# Patient Record
Sex: Male | Born: 1960 | Race: White | Hispanic: No | Smoking: Never smoker
Health system: Southern US, Community
[De-identification: ages and names within clinical notes are randomized; demographics above are authoritative.]

## PROBLEM LIST (undated history)

## (undated) DIAGNOSIS — G4730 Sleep apnea, unspecified: Secondary | ICD-10-CM

## (undated) DIAGNOSIS — K279 Peptic ulcer, site unspecified, unspecified as acute or chronic, without hemorrhage or perforation: Secondary | ICD-10-CM

## (undated) DIAGNOSIS — E119 Type 2 diabetes mellitus without complications: Secondary | ICD-10-CM

## (undated) DIAGNOSIS — I10 Essential (primary) hypertension: Secondary | ICD-10-CM

## (undated) DIAGNOSIS — E785 Hyperlipidemia, unspecified: Secondary | ICD-10-CM

## (undated) DIAGNOSIS — I700 Atherosclerosis of aorta: Secondary | ICD-10-CM

## (undated) DIAGNOSIS — I712 Thoracic aortic aneurysm, without rupture: Secondary | ICD-10-CM

## (undated) DIAGNOSIS — Q613 Polycystic kidney, unspecified: Secondary | ICD-10-CM

## (undated) DIAGNOSIS — K4021 Bilateral inguinal hernia, without obstruction or gangrene, recurrent: Secondary | ICD-10-CM

## (undated) DIAGNOSIS — I7121 Aneurysm of the ascending aorta, without rupture: Secondary | ICD-10-CM

## (undated) DIAGNOSIS — N183 Chronic kidney disease, stage 3 unspecified: Secondary | ICD-10-CM

## (undated) HISTORY — DX: Chronic kidney disease, stage 3 (moderate): N18.3

## (undated) HISTORY — DX: Type 2 diabetes mellitus without complications: E11.9

## (undated) HISTORY — DX: Aneurysm of the ascending aorta, without rupture: I71.21

## (undated) HISTORY — DX: Atherosclerosis of aorta: I70.0

## (undated) HISTORY — DX: Thoracic aortic aneurysm, without rupture: I71.2

## (undated) HISTORY — DX: Essential (primary) hypertension: I10

## (undated) HISTORY — DX: Polycystic kidney, unspecified: Q61.3

## (undated) HISTORY — DX: Hyperlipidemia, unspecified: E78.5

## (undated) HISTORY — DX: Chronic kidney disease, stage 3 unspecified: N18.30

## (undated) HISTORY — DX: Peptic ulcer, site unspecified, unspecified as acute or chronic, without hemorrhage or perforation: K27.9

## (undated) HISTORY — PX: HEMORRHOID SURGERY: SHX153

---

## 2000-10-03 ENCOUNTER — Encounter: Admission: RE | Admit: 2000-10-03 | Discharge: 2000-11-15 | Payer: Self-pay | Admitting: Nephrology

## 2001-04-06 ENCOUNTER — Encounter: Payer: Self-pay | Admitting: Nephrology

## 2001-04-06 ENCOUNTER — Encounter: Admission: RE | Admit: 2001-04-06 | Discharge: 2001-04-06 | Payer: Self-pay | Admitting: Nephrology

## 2002-03-11 ENCOUNTER — Encounter: Payer: Self-pay | Admitting: Cardiology

## 2002-03-11 ENCOUNTER — Encounter: Admission: RE | Admit: 2002-03-11 | Discharge: 2002-03-11 | Payer: Self-pay | Admitting: Cardiology

## 2002-03-12 ENCOUNTER — Ambulatory Visit (HOSPITAL_COMMUNITY): Admission: RE | Admit: 2002-03-12 | Discharge: 2002-03-12 | Payer: Self-pay | Admitting: Cardiology

## 2002-03-12 ENCOUNTER — Encounter: Payer: Self-pay | Admitting: Cardiology

## 2003-04-24 ENCOUNTER — Ambulatory Visit (HOSPITAL_COMMUNITY): Admission: RE | Admit: 2003-04-24 | Discharge: 2003-04-24 | Payer: Self-pay | Admitting: Cardiology

## 2004-11-03 ENCOUNTER — Ambulatory Visit (HOSPITAL_COMMUNITY): Admission: RE | Admit: 2004-11-03 | Discharge: 2004-11-03 | Payer: Self-pay | Admitting: Cardiology

## 2006-05-02 ENCOUNTER — Emergency Department (HOSPITAL_COMMUNITY): Admission: EM | Admit: 2006-05-02 | Discharge: 2006-05-02 | Payer: Self-pay | Admitting: Emergency Medicine

## 2013-02-08 ENCOUNTER — Other Ambulatory Visit (HOSPITAL_COMMUNITY): Payer: Self-pay | Admitting: Family Medicine

## 2013-02-08 DIAGNOSIS — R109 Unspecified abdominal pain: Secondary | ICD-10-CM

## 2013-02-08 DIAGNOSIS — I712 Thoracic aortic aneurysm, without rupture, unspecified: Secondary | ICD-10-CM

## 2013-02-22 ENCOUNTER — Ambulatory Visit (HOSPITAL_COMMUNITY)
Admission: RE | Admit: 2013-02-22 | Discharge: 2013-02-22 | Disposition: A | Discharge status: Home / Self Care | Payer: BC Managed Care – PPO | Source: Ambulatory Visit | Attending: Family Medicine | Admitting: Family Medicine

## 2013-02-22 DIAGNOSIS — I2510 Atherosclerotic heart disease of native coronary artery without angina pectoris: Secondary | ICD-10-CM | POA: Insufficient documentation

## 2013-02-22 DIAGNOSIS — R109 Unspecified abdominal pain: Secondary | ICD-10-CM

## 2013-02-22 DIAGNOSIS — I712 Thoracic aortic aneurysm, without rupture: Secondary | ICD-10-CM

## 2013-02-22 DIAGNOSIS — I700 Atherosclerosis of aorta: Secondary | ICD-10-CM | POA: Insufficient documentation

## 2013-02-22 DIAGNOSIS — I77819 Aortic ectasia, unspecified site: Secondary | ICD-10-CM | POA: Insufficient documentation

## 2013-02-22 DIAGNOSIS — Q613 Polycystic kidney, unspecified: Secondary | ICD-10-CM | POA: Insufficient documentation

## 2013-03-05 DIAGNOSIS — I712 Thoracic aortic aneurysm, without rupture: Secondary | ICD-10-CM | POA: Insufficient documentation

## 2013-03-06 ENCOUNTER — Encounter: Payer: Self-pay | Admitting: Surgery

## 2013-03-06 ENCOUNTER — Institutional Professional Consult (permissible substitution) (INDEPENDENT_AMBULATORY_CARE_PROVIDER_SITE_OTHER): Payer: BC Managed Care – PPO | Admitting: Surgery

## 2013-03-06 VITALS — BP 121/77 | HR 69 | Resp 20 | Ht 67.0 in | Wt 208.0 lb

## 2013-03-06 DIAGNOSIS — I712 Thoracic aortic aneurysm, without rupture: Secondary | ICD-10-CM

## 2013-03-07 ENCOUNTER — Encounter: Payer: Self-pay | Admitting: Surgery

## 2013-03-07 ENCOUNTER — Encounter: Payer: Self-pay | Admitting: Cardiology

## 2013-03-07 DIAGNOSIS — I10 Essential (primary) hypertension: Secondary | ICD-10-CM | POA: Insufficient documentation

## 2013-03-07 DIAGNOSIS — E785 Hyperlipidemia, unspecified: Secondary | ICD-10-CM

## 2013-03-07 DIAGNOSIS — I700 Atherosclerosis of aorta: Secondary | ICD-10-CM | POA: Insufficient documentation

## 2013-03-07 HISTORY — DX: Hyperlipidemia, unspecified: E78.5

## 2013-03-07 HISTORY — DX: Essential (primary) hypertension: I10

## 2013-03-07 HISTORY — DX: Atherosclerosis of aorta: I70.0

## 2013-03-07 NOTE — Progress Notes (Signed)
PCP is Lupita Raider, MD Referring Provider is Lupita Raider, MD  Chief Complaint  Patient presents with  . Thoracic Aortic Aneurysm    Eval ascending aortic aneursym 4.2, CT done 11/7    HPI:   The patient is a 52 year old gentleman with diabetes, HTN, and hyperlipidemia and a hx of ascending aortic aneurysm first diagnosed on CT scan in 2003. It was noted to be 4.2 cm at that time. He had been followed by Dr. Caprice Kluver in the past and apparently underwent cardiac cath in the past with no significant coronary stenoses found, although I can't find the report in EPIC. He had follow up Chest CT scan in 2005 and the aneurysm was 4.3 cm. A scan in 2006 gave a measurement of the aneurysm of 3.7 cm. A follow-up scan now shows the diameter to be 4.2 cm. He reports some left sided chest discomfort that is not always exertionally related. There is no associated dyspnea and no radiation of the chest discomfort. His exertional tolerance is unchanged.  Past Medical History  Diagnosis Date  . Ascending aortic aneurysm   . Diabetes mellitus   . HBP (high blood pressure)   . Hyperlipidemia   . CKD (chronic kidney disease), stage III   . Polycystic kidney disease   . PUD (peptic ulcer disease)     History reviewed. No pertinent past surgical history.  Family History  Problem Relation Age of Onset  . Polycystic kidney disease Mother   . Polycystic kidney disease Brother   . Diabetes Sister     Social History History  Substance Use Topics  . Smoking status: Never Smoker   . Smokeless tobacco: Never Used  . Alcohol Use: No    Current Outpatient Prescriptions  Medication Sig Dispense Refill  . atenolol (TENORMIN) 50 MG tablet Take 50 mg by mouth daily. Take 1 and 1/2 tablet per day      . colesevelam (WELCHOL) 625 MG tablet Take 1,875 mg by mouth 2 (two) times daily with a meal.      . simvastatin (ZOCOR) 40 MG tablet Take 40 mg by mouth daily.      Marland Kitchen telmisartan (MICARDIS) 40 MG  tablet Take 40 mg by mouth daily. Take 1/2 tablet per day       No current facility-administered medications for this visit.    No Known Allergies  Review of Systems  Constitutional: Negative for fever, activity change and fatigue.  Eyes: Negative.   Respiratory: Positive for chest tightness. Negative for shortness of breath.   Cardiovascular: Positive for chest pain. Negative for leg swelling.  Endocrine: Negative.   Genitourinary: Negative.   Musculoskeletal: Negative.   Skin: Negative.   Allergic/Immunologic: Negative.   Neurological: Negative.   Hematological: Negative.   Psychiatric/Behavioral: Negative.     BP 121/77  Pulse 69  Resp 20  Ht 5\' 7"  (1.702 m)  Wt 208 lb (94.348 kg)  BMI 32.57 kg/m2  SpO2 97% Physical Exam  Constitutional: He is oriented to person, place, and time. No distress.  HENT:  Head: Atraumatic.  Mouth/Throat: Oropharynx is clear and moist.  Eyes: EOM are normal. Pupils are equal, round, and reactive to light.  Neck: Normal range of motion. Neck supple. No JVD present. No thyromegaly present.  Cardiovascular: Normal rate, regular rhythm, normal heart sounds and intact distal pulses.  Exam reveals no gallop and no friction rub.   No murmur heard. Pulmonary/Chest: Effort normal and breath sounds normal. No  respiratory distress. He has no rales.  Abdominal: Soft. Bowel sounds are normal. He exhibits no distension and no mass. There is no tenderness.  Musculoskeletal: Normal range of motion. He exhibits no edema.  Lymphadenopathy:    He has no cervical adenopathy.  Neurological: He is alert and oriented to person, place, and time. He has normal strength. No cranial nerve deficit or sensory deficit.  Skin: Skin is warm and dry.  Psychiatric: He has a normal mood and affect.     Diagnostic Tests:  CT CHEST WITHOUT CONTRAST  02/22/2013 TECHNIQUE:  Multidetector CT imaging of the chest was performed following the  standard protocol without IV  contrast.  COMPARISON: Chest CTs dated 11/03/2004 and 04/24/2003.  FINDINGS:  There is mildly progressive dilatation of the aortic root and  ascending aorta compared with the prior studies. The ascending aorta  now measures up to 4.2 cm transverse. No calcifications are seen  within the aortic valve. There is atherosclerotic calcification  within the aortic arch, descending aorta, great vessels and coronary  arteries.  No enlarged mediastinal or hilar lymph nodes are identified. The  thyroid gland and tracheobronchial tree appear normal. There is no  significant pleural or pericardial effusion.  The lungs are clear.  Images through the upper abdomen again demonstrate multiple renal  cysts of varying density, similar to the prior examination. There is  a small cyst in the caudate lobe of the liver. There are no  worrisome osseous findings.  IMPRESSION:  1. Progressive dilatation of the ascending aorta since 2006. Maximal  diameter is now 4.2 cm. Luminal assessment is limited without  contrast.  2. Atherosclerosis of the great vessels and coronary arteries noted.  3. Polycystic renal disease.  Electronically Signed  By: Roxy Horseman M.D.  On: 02/22/2013 15:05   Impression:  I personally reviewed the CT scans dating back to 02/2002 and the ascending aortic aneurysm is unchanged in size comparing the scan in 2003 to the current scan with a diameter of 4.2 cm. I don't know if his aortic valve is bicuspid since he does not have an echo on file but I don't hear a murmur. There is no family history of aneurysm or bicuspid valve disease. I would not recommend replacement of his aneurysm unless it rapidly changed greater than 5 mm in a 1 year period or if it enlarged to 5.5 cm. If he had a bicuspid aortic valve then I would recommend surgery if it reached 5 cm. He is having some chest pain which does not sound anginal but could be. He does have a lot of calcium in the left main and proximal LAD and  LCx on his CT scan. I think he should be evaluated by cardiology and should have a baseline echo to evaluate the aortic valve. I will plan to see him back in 1 year with a CTA of the chest to reevaluate the aneurysm. His BP is under good control and he is on a beta blocker.  Plan:  He will be referred to Dr. Donato Schultz for cardiology evaluation. I will see him in 1 year with a CTA of the chest.

## 2013-03-21 ENCOUNTER — Encounter: Payer: Self-pay | Admitting: Cardiology

## 2013-03-22 ENCOUNTER — Ambulatory Visit (INDEPENDENT_AMBULATORY_CARE_PROVIDER_SITE_OTHER): Payer: BC Managed Care – PPO | Admitting: Cardiology

## 2013-03-22 ENCOUNTER — Encounter: Payer: Self-pay | Admitting: Cardiology

## 2013-03-22 VITALS — BP 108/74 | HR 65 | Ht 67.0 in | Wt 208.8 lb

## 2013-03-22 DIAGNOSIS — I712 Thoracic aortic aneurysm, without rupture: Secondary | ICD-10-CM

## 2013-03-22 DIAGNOSIS — E119 Type 2 diabetes mellitus without complications: Secondary | ICD-10-CM

## 2013-03-22 DIAGNOSIS — I700 Atherosclerosis of aorta: Secondary | ICD-10-CM

## 2013-03-22 DIAGNOSIS — Q613 Polycystic kidney, unspecified: Secondary | ICD-10-CM | POA: Insufficient documentation

## 2013-03-22 DIAGNOSIS — I10 Essential (primary) hypertension: Secondary | ICD-10-CM

## 2013-03-22 DIAGNOSIS — R079 Chest pain, unspecified: Secondary | ICD-10-CM

## 2013-03-22 DIAGNOSIS — E785 Hyperlipidemia, unspecified: Secondary | ICD-10-CM

## 2013-03-22 HISTORY — DX: Polycystic kidney, unspecified: Q61.3

## 2013-03-22 NOTE — Progress Notes (Signed)
[address] [place], [place]  [address] Phone: [phone] Fax:  [phone]  Date:  03/22/2013   ID:  Carlos Joyce, DOB [birth year], MRN [id number]  PCP:  Lupita Raider, MD   History of Present Illness: Carlos Joyce is a 52 y.o. male with hypertension, hyperlipidemia, PKD, DM, Tob.  history of ascending aortic aneurysm, 4.2 cm here for evaluation of dyspnea/decreased exertional tolerance. Occasionally will report left-sided chest discomfort not always exertional. Somewhat atypical. Under left breast, mild. Been present for quite some time.  Referred by Dr. Laneta Simmers. Tob use - 3-4 a day. +DM. 2003 heart cath normal.    Wt Readings from Last 3 Encounters:  03/22/13 208 lb 12.8 oz (94.711 kg)  03/06/13 208 lb (94.348 kg)     Past Medical History  Diagnosis Date  . Ascending aortic aneurysm   . Diabetes mellitus   . HBP (high blood pressure)   . Hyperlipidemia   . CKD (chronic kidney disease), stage III   . Polycystic kidney disease   . PUD (peptic ulcer disease)   . Aortic atherosclerosis 03/07/2013  . Essential hypertension 03/07/2013  . Hyperlipemia 03/07/2013    No past surgical history on file.  Current Outpatient Prescriptions  Medication Sig Dispense Refill  . atenolol (TENORMIN) 50 MG tablet Take 1 1/2 tablets once a day      . colesevelam (WELCHOL) 625 MG tablet Take 1,875 mg by mouth 2 (two) times daily with a meal.      . simvastatin (ZOCOR) 40 MG tablet Take 40 mg by mouth daily.      Marland Kitchen telmisartan (MICARDIS) 40 MG tablet Take 1/2 tablet once a day       No current facility-administered medications for this visit.    Allergies:   No Known Allergies  Social History:  The patient  reports that he has never smoked. He has never used smokeless tobacco. He reports that he does not drink alcohol or use illicit drugs.   Family History  Problem Relation Age of Onset  . Polycystic kidney disease Mother   . Polycystic kidney disease Brother     . Diabetes Sister    No early CAD.   ROS:  Please see the history of present illness.   Denies any fevers, chills, orthopnea, PND, syncope   All other systems reviewed and negative.   PHYSICAL EXAM: VS:  BP 108/74  Pulse 65  Ht 5\' 7"  (1.702 m)  Wt 208 lb 12.8 oz (94.711 kg)  BMI 32.69 kg/m2 Well nourished, well developed, in no acute distress HEENT: normal, [place]/AT, EOMI Neck: no JVD, normal carotid upstroke, no bruit Cardiac:  normal S1, S2; RRR; no murmur Lungs:  clear to auscultation bilaterally, no wheezing, rhonchi or rales Abd: soft, nontender, no hepatomegaly, no bruits Ext: no edema, 2+ distal pulses Skin: warm and dry GU: deferred Neuro: no focal abnormalities noted, AAO x 3  EKG:  Normal rhythm, 65, no other abnormalities    LDL less than 100 however triglycerides were elevated at 288. He will A1c 7.0, creatinine 1.42 Prior medical records reviewed.  ASSESSMENT AND PLAN:  1. Dilated aortic root-monitored by Dr. Laneta Simmers. 4.2 cm. 2. Dyspnea/atypical chest pain-EKG reassuring. Given risk factors we will proceed with stress test. Calcium was seen in coronary arteries. Diabetes, smoker. Nuclear stress test. Note mildly elevated creatinine. 3. Tobacco use-tobacco cessation counseling. 4. Aortic atherosclerosis-continue with simvastatin, tobacco cessation. 5. We will see back in one year  or sooner if stress test abnormal.  Signed, Donato Schultz, MD [facility]  03/22/2013 10:19 AM

## 2013-03-22 NOTE — Patient Instructions (Signed)
Your physician recommends that you continue on your current medications as directed. Please refer to the Current Medication list given to you today.  Your physician has requested that you have en exercise stress myoview. For further information please visit [email or web address] Please follow instruction sheet, as given.  Your physician wants you to follow-up in: 1 year Dr. Anne Fu. You will receive a reminder letter in the mail two months in advance. If you don't receive a letter, please call our office to schedule the follow-up appointment.

## 2013-04-09 ENCOUNTER — Encounter: Payer: Self-pay | Admitting: Cardiology

## 2013-04-09 ENCOUNTER — Ambulatory Visit (HOSPITAL_COMMUNITY): Payer: BC Managed Care – PPO | Attending: Cardiology | Admitting: Radiology

## 2013-04-09 VITALS — BP 109/79 | Ht 68.0 in | Wt 206.0 lb

## 2013-04-09 DIAGNOSIS — R0989 Other specified symptoms and signs involving the circulatory and respiratory systems: Secondary | ICD-10-CM | POA: Insufficient documentation

## 2013-04-09 DIAGNOSIS — I714 Abdominal aortic aneurysm, without rupture, unspecified: Secondary | ICD-10-CM | POA: Insufficient documentation

## 2013-04-09 DIAGNOSIS — R0609 Other forms of dyspnea: Secondary | ICD-10-CM | POA: Insufficient documentation

## 2013-04-09 DIAGNOSIS — I10 Essential (primary) hypertension: Secondary | ICD-10-CM | POA: Insufficient documentation

## 2013-04-09 DIAGNOSIS — F17200 Nicotine dependence, unspecified, uncomplicated: Secondary | ICD-10-CM | POA: Insufficient documentation

## 2013-04-09 DIAGNOSIS — R079 Chest pain, unspecified: Secondary | ICD-10-CM | POA: Insufficient documentation

## 2013-04-09 DIAGNOSIS — E119 Type 2 diabetes mellitus without complications: Secondary | ICD-10-CM | POA: Insufficient documentation

## 2013-04-09 MED ORDER — TECHNETIUM TC 99M SESTAMIBI GENERIC - CARDIOLITE
30.0000 | Freq: Once | INTRAVENOUS | Status: AC | PRN
  Administered 2013-04-09: 30 via INTRAVENOUS

## 2013-04-09 MED ORDER — TECHNETIUM TC 99M SESTAMIBI GENERIC - CARDIOLITE
10.0000 | Freq: Once | INTRAVENOUS | Status: AC | PRN
  Administered 2013-04-09: 10 via INTRAVENOUS

## 2013-04-09 MED ORDER — REGADENOSON 0.4 MG/5ML IV SOLN
0.4000 mg | Freq: Once | INTRAVENOUS | Status: AC
  Administered 2013-04-09: 0.4 mg via INTRAVENOUS

## 2013-04-09 NOTE — Progress Notes (Signed)
[facility] SITE 3 NUCLEAR MED [address] [place], [place] [address] [phone]    Cardiology Nuclear Med Study  Carlos Joyce is a 52 y.o. male     MRN : [id number]     DOB: [birth year]  Procedure Date: 04/09/2013  Nuclear Med Background Indication for Stress Test:  Evaluation for Ischemia History:  2003 CATH: NL Cardiac Risk Factors: Hypertension, Lipids, NIDDM, Smoker and AAA 4.2  Symptoms:  Chest Pain   Nuclear Pre-Procedure Caffeine/Decaff Intake:  None NPO After: 8:30pm   Lungs:  clear O2 Sat: 98% on room air. IV 0.9% NS with Angio Cath:  20g  IV Site: R Hand  IV Started by:  Cathlyn Parsons, RN  Chest Size (in):  44 Cup Size: n/a  Height: 5\' 8"  (1.727 m)  Weight:  206 lb (93.441 kg)  BMI:  Body mass index is 31.33 kg/(m^2). Tech Comments:  No Atenolol x 24 hrs    Nuclear Med Study 1 or 2 day study: 1 day  Stress Test Type:  Lexiscan  Reading MD: Donato Schultz, MD  Order Authorizing Provider:  Louis Matte  Resting Radionuclide: Technetium 17m Sestamibi  Resting Radionuclide Dose: 11.0 mCi   Stress Radionuclide:  Technetium 46m Sestamibi  Stress Radionuclide Dose: 33.0 mCi           Stress Protocol Rest HR: 63 Stress HR: 98  Rest BP: 109/79 Stress BP: 126/88  Exercise Time (min): n/a METS: n/a   Predicted Max HR: 170 bpm % Max HR: 57.65 bpm Rate Pressure Product: 21308   Dose of Adenosine (mg):  n/a Dose of Lexiscan: 0.4 mg  Dose of Atropine (mg): n/a Dose of Dobutamine: n/a mcg/kg/min (at max HR)  Stress Test Technologist: Milana Na, EMT-P  Nuclear Technologist:  Doyne Keel, CNMT     Rest Procedure:  Myocardial perfusion imaging was performed at rest 45 minutes following the intravenous administration of Technetium 77m Sestamibi. Rest ECG: NSR - Normal EKG  Stress Procedure:  The patient received IV Lexiscan 0.4 mg over 15-seconds.  Technetium 5m Sestamibi injected at 30-seconds. This patient had sob and felt weird with the  Lexiscan injection. Quantitative spect images were obtained after a 45 minute delay. Stress ECG: No significant ST segment change suggestive of ischemia.  QPS Raw Data Images:  Acquisition technically good; LVE. Stress Images:  There is decreased uptake in the apex. Rest Images:  There is decreased uptake in the apex, slightly less prominent compared to the stress images. Subtraction (SDS):  These findings are consistent with apical thinning; cannot R/O very mild apical ischemia. Transient Ischemic Dilatation (Normal <1.22):  1.04 Lung/Heart Ratio (Normal <0.45):  0.30  Quantitative Gated Spect Images QGS EDV:  149 ml QGS ESV:  76 ml  Impression Exercise Capacity:  Lexiscan with no exercise. BP Response:  Normal blood pressure response. Clinical Symptoms:  There is dyspnea. ECG Impression:  No significant ST segment change suggestive of ischemia. Comparison with Prior Nuclear Study: No previous nuclear study performed  Overall Impression:  Low risk stress nuclear study with a small, moderate intensity, partially reversible apical defect consistent with apical thinning; cannot R/O very mild apical ischemia.  LV Ejection Fraction: 49%.  LV Wall Motion:  Mild global hypokinesis; LVE.   Olga Millers

## 2013-08-16 ENCOUNTER — Encounter: Payer: Self-pay | Admitting: Cardiology

## 2014-02-05 ENCOUNTER — Other Ambulatory Visit: Payer: Self-pay | Admitting: *Deleted

## 2014-02-05 DIAGNOSIS — I712 Thoracic aortic aneurysm, without rupture, unspecified: Secondary | ICD-10-CM

## 2014-03-11 ENCOUNTER — Other Ambulatory Visit: Payer: Self-pay | Admitting: *Deleted

## 2014-03-11 DIAGNOSIS — I712 Thoracic aortic aneurysm, without rupture, unspecified: Secondary | ICD-10-CM

## 2014-03-12 ENCOUNTER — Ambulatory Visit
Admission: RE | Admit: 2014-03-12 | Discharge: 2014-03-12 | Disposition: A | Discharge status: Home / Self Care | Payer: BC Managed Care – PPO | Source: Ambulatory Visit | Attending: Surgery | Admitting: Surgery

## 2014-03-12 ENCOUNTER — Encounter: Payer: Self-pay | Admitting: Surgery

## 2014-03-12 ENCOUNTER — Ambulatory Visit (INDEPENDENT_AMBULATORY_CARE_PROVIDER_SITE_OTHER): Payer: BC Managed Care – PPO | Admitting: Surgery

## 2014-03-12 VITALS — BP 116/67 | HR 70 | Resp 20 | Ht 68.0 in | Wt 209.0 lb

## 2014-03-12 DIAGNOSIS — I712 Thoracic aortic aneurysm, without rupture, unspecified: Secondary | ICD-10-CM

## 2014-03-12 MED ORDER — IOHEXOL 350 MG/ML SOLN
60.0000 mL | Freq: Once | INTRAVENOUS | Status: AC | PRN
  Administered 2014-03-12: 60 mL via INTRAVENOUS

## 2014-03-16 ENCOUNTER — Encounter: Payer: Self-pay | Admitting: Surgery

## 2014-03-16 NOTE — Progress Notes (Signed)
      HPI:  The patient returns today for follow up of an ascending aortic aneurysm. When I saw him one year ago for initial consultation it measured 4.2 cm and had not changed compared to a CT scan in 02/2002. Since last year he has continued to do well and denies any chest or back pain.  Current Outpatient Prescriptions  Medication Sig Dispense Refill  . atenolol (TENORMIN) 50 MG tablet Take 50 mg by mouth daily.     . colesevelam (WELCHOL) 625 MG tablet Take 1,875 mg by mouth 2 (two) times daily with a meal.    . simvastatin (ZOCOR) 40 MG tablet Take 40 mg by mouth daily.    Marland Kitchen. telmisartan (MICARDIS) 40 MG tablet Take 1/2 tablet once a day     No current facility-administered medications for this visit.    Physical Exam:  BP 116/67 mmHg  Pulse 70  Resp 20  Ht 5\' 8"  (1.727 m)  Wt 209 lb (94.802 kg)  BMI 31.79 kg/m2  SpO2  He looks well Cardiac exam shows a regular rate and rhythm with normal heart sounds  Diagnostic Tests:  CLINICAL DATA: Ascending thoracic aortic aneurysm.  EXAM: CT ANGIOGRAPHY CHEST WITH CONTRAST  TECHNIQUE: Multidetector CT imaging of the chest was performed using the standard protocol during bolus administration of intravenous contrast. Multiplanar CT image reconstructions and MIPs were obtained to evaluate the vascular anatomy.  CONTRAST: 60mL OMNIPAQUE IOHEXOL 350 MG/ML SOLN  COMPARISON: 02/22/2013  FINDINGS: Vascular/Cardiac: Ascending thoracic aortic aneurysm is seen measuring 4.4 cm on image 59 of series 4, compared to 4.2 cm previously. No evidence of descending thoracic aortic aneurysm. No evidence of thoracic aortic dissection.  Mediastinum/Hilar Regions: No masses or pathologically enlarged lymph nodes identified.  Lungs: No pulmonary infiltrate or mass identified.  Pleura: No evidence of effusion or mass.  Musculoskeletal: No suspicious bone lesions identified.  Other: Numerous bilateral renal cysts again  seen, consistent with polycystic kidney disease.  Review of the MIP images confirms the above findings.  IMPRESSION: 4.4 cm ascending thoracic aortic aneurysm, compared to 4.2 cm previously. Ectatic to mildly aneurysmal ascending thoracic aorta. Recommend annual imaging followup by CTA or MRA. This recommendation follows 2010 ACCF/AHA/AATS/ACR/ASA/SCA/SCAI/SIR/STS/SVM Guidelines for the Diagnosis and Management of Patients With Thoracic Aortic Disease. Circulation.2010; 121: W098-J191: [id number]  Polycystic kidney disease again noted.   Electronically Signed  By: Myles RosenthalJohn Stahl M.D.  On: 03/12/2014 14:27   Impression:  The ascending aortic aneurysm has a diameter of 4.4 cm this year compared to 4.2 cm one year ago. This warrants continued close follow up with a CT scan in 1 year. I reviewed the CT scan with the patient and his wife and answered their questions. His BP is under good control on Tenormin and Micardis.  Plan:  Return in 1 year with CTA of the chest.

## 2014-03-28 ENCOUNTER — Encounter: Payer: Self-pay | Admitting: Cardiology

## 2014-03-28 ENCOUNTER — Ambulatory Visit (INDEPENDENT_AMBULATORY_CARE_PROVIDER_SITE_OTHER): Payer: BC Managed Care – PPO | Admitting: Cardiology

## 2014-03-28 VITALS — BP 110/80 | HR 68 | Ht 68.0 in | Wt 206.0 lb

## 2014-03-28 DIAGNOSIS — I77810 Thoracic aortic ectasia: Secondary | ICD-10-CM

## 2014-03-28 DIAGNOSIS — I10 Essential (primary) hypertension: Secondary | ICD-10-CM

## 2014-03-28 NOTE — Patient Instructions (Signed)
The current medical regimen is effective;  continue present plan and medications.  Follow up in 1 year with Dr Skains.  You will receive a letter in the mail 2 months before you are due.  Please call us when you receive this letter to schedule your follow up appointment.  

## 2014-03-28 NOTE — Progress Notes (Signed)
      [address] [place], [place]  [address] Phone: [phone] Fax:  [phone]  Date:  03/28/2014   ID:  Carlos Joyce, DOB [birth year], MRN [id number]  PCP:  Lupita RaiderSHAW,KIMBERLEE, MD   History of Present Illness: Carlos Joyce is a 53 y.o. male with ascending aortic aneurysm 4.4 cm seen by Dr. Laneta SimmersBartle in November 2015, 4.2 cm ago. Close follow-up with CT scan in one year. Blood pressure under good control with atenolol and Micardis.  Overall doing very well. No symptoms. Has polycystic kidney disease. Following with Dr. Briant CedarMattingly.   Wt Readings from Last 3 Encounters:  03/28/14 206 lb (93.441 kg)  03/12/14 209 lb (94.802 kg)  04/09/13 206 lb (93.441 kg)     Past Medical History  Diagnosis Date  . Ascending aortic aneurysm   . Diabetes mellitus   . HBP (high blood pressure)   . Hyperlipidemia   . CKD (chronic kidney disease), stage III   . Polycystic kidney disease   . PUD (peptic ulcer disease)   . Aortic atherosclerosis 03/07/2013  . Essential hypertension 03/07/2013  . Hyperlipemia 03/07/2013  . PKD (polycystic kidney disease) 03/22/2013    Creat 1.4 10/14    No past surgical history on file.  Current Outpatient Prescriptions  Medication Sig Dispense Refill  . atenolol (TENORMIN) 50 MG tablet Take 50 mg by mouth daily.     . colesevelam (WELCHOL) 625 MG tablet Take 1,875 mg by mouth 2 (two) times daily with a meal.    . simvastatin (ZOCOR) 40 MG tablet Take 40 mg by mouth daily.    Marland Kitchen. telmisartan (MICARDIS) 40 MG tablet Take 1/2 tablet once a day     No current facility-administered medications for this visit.    Allergies:   No Known Allergies  Social History:  The patient  reports that he has never smoked. He has never used smokeless tobacco. He reports that he does not drink alcohol or use illicit drugs.   Family History  Problem Relation Age of Onset  . Polycystic kidney disease Mother   . Polycystic kidney disease Brother   . Diabetes  Sister     ROS:  Please see the history of present illness.   Denies any syncope, bleeding, chest pain, shortness of breath   All other systems reviewed and negative.   PHYSICAL EXAM: VS:  BP 110/80 mmHg  Pulse 68  Ht 5\' 8"  (1.727 m)  Wt 206 lb (93.441 kg)  BMI 31.33 kg/m2 Well nourished, well developed, in no acute distress HEENT: normal, [place]/AT, EOMI Neck: no JVD, normal carotid upstroke, no bruit Cardiac:  normal S1, S2; RRR; no murmur Lungs:  clear to auscultation bilaterally, no wheezing, rhonchi or rales Abd: soft, nontender, no hepatomegaly, no bruits Ext: no edema, 2+ distal pulses Skin: warm and dry GU: deferred Neuro: no focal abnormalities noted, AAO x 3  EKG:  03/28/14-sinus rhythm, 68 with no other modalities    ASSESSMENT AND PLAN:  1. Dilated aortic root-increased slightly to 44 mm from 42 mm the year previously. Dr. Laneta SimmersBartle has been seeing. He has repeat CT scan in one year. He will see me back in January 2017. Avoid power lifting. 2. Essential hypertension-well controlled. Medications reviewed.  Signed, Donato SchultzMark Huel Centola, MD [facility]  03/28/2014 3:44 PM

## 2014-10-10 ENCOUNTER — Other Ambulatory Visit: Payer: Self-pay | Admitting: Surgery

## 2014-12-17 ENCOUNTER — Encounter (HOSPITAL_BASED_OUTPATIENT_CLINIC_OR_DEPARTMENT_OTHER): Payer: Self-pay | Admitting: *Deleted

## 2014-12-23 ENCOUNTER — Encounter (HOSPITAL_BASED_OUTPATIENT_CLINIC_OR_DEPARTMENT_OTHER)
Admission: RE | Admit: 2014-12-23 | Discharge: 2014-12-23 | Disposition: A | Discharge status: Home / Self Care | Payer: 59 | Source: Ambulatory Visit | Attending: Surgery | Admitting: Surgery

## 2014-12-23 DIAGNOSIS — I129 Hypertensive chronic kidney disease with stage 1 through stage 4 chronic kidney disease, or unspecified chronic kidney disease: Secondary | ICD-10-CM | POA: Diagnosis not present

## 2014-12-23 DIAGNOSIS — G4730 Sleep apnea, unspecified: Secondary | ICD-10-CM | POA: Diagnosis not present

## 2014-12-23 DIAGNOSIS — I739 Peripheral vascular disease, unspecified: Secondary | ICD-10-CM | POA: Diagnosis not present

## 2014-12-23 DIAGNOSIS — E1122 Type 2 diabetes mellitus with diabetic chronic kidney disease: Secondary | ICD-10-CM | POA: Diagnosis not present

## 2014-12-23 DIAGNOSIS — N189 Chronic kidney disease, unspecified: Secondary | ICD-10-CM | POA: Diagnosis not present

## 2014-12-23 DIAGNOSIS — K4020 Bilateral inguinal hernia, without obstruction or gangrene, not specified as recurrent: Secondary | ICD-10-CM | POA: Diagnosis not present

## 2014-12-23 DIAGNOSIS — K429 Umbilical hernia without obstruction or gangrene: Secondary | ICD-10-CM | POA: Diagnosis not present

## 2014-12-23 LAB — BASIC METABOLIC PANEL
ANION GAP: 7 (ref 5–15)
BUN: 14 mg/dL (ref 6–20)
CHLORIDE: 108 mmol/L (ref 101–111)
CO2: 25 mmol/L (ref 22–32)
Calcium: 8.9 mg/dL (ref 8.9–10.3)
Creatinine, Ser: 1.62 mg/dL — ABNORMAL HIGH (ref 0.61–1.24)
GFR calc Af Amer: 55 mL/min — ABNORMAL LOW (ref >60)
GFR, EST NON AFRICAN AMERICAN: 47 mL/min — AB (ref >60)
Glucose, Bld: 154 mg/dL — ABNORMAL HIGH (ref 65–99)
POTASSIUM: 4.1 mmol/L (ref 3.5–5.1)
SODIUM: 140 mmol/L (ref 135–145)

## 2014-12-23 NOTE — H&P (Signed)
Carlos Joyce  Location: [facility] Patient #: [id number] DOB: [birth year] Married / Language: English / Race: White Male  History of Present Illness Carlos Lam A. Magnus Ivan MD Patient words: hernia.  The patient is a 54 year old male who presents for an evaluation of a hernia. This is a pleasant gentleman referred by Dr. Cam Hai for a symptomatic right inguinal hernia. He reports he has had hernias for many years but it is getting larger and now causing discomfort in the groin. He is still able to reduce it. He has had no nausea or vomiting or obstructive symptoms. He is otherwise without complaints. 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 He also has suspected sleep apnea   Other Problems Carlos Joyce; Diabetes Mellitus Sleep Apnea  Allergies Carlos Joyce, [place]; 10/10/2014 10:48 AM) No Known Drug Allergies06/24/2016  Medication History Carlos Joyce;  Trulicity (1.5MG /0.5ML Soln Pen-inj, Subcutaneous) Active. Fenofibrate Micronized (  Tablet, Oral) Active. Triamcinolone Acetonide (0.1% Ointment, External) Active. Tenormin (  Tablet, Oral) Active. Welchol (  Tablet, Oral) Active. Zocor (  Tablet, Oral) Active. Micardis (  Tablet, Oral) Active. Medications Reconciled  Social History Carlos Joyce, [place];  Caffeine use Carbonated beverages. Illicit drug use Remotely quit drug use.  Family History Carlos Joyce;  Kidney Disease Brother, Mother.  Review of Systems Carlos Joyce Joyce Skin Present- Rash. Not Present- Change in Wart/Mole, Dryness, Hives, Jaundice, New Lesions, Non-Healing Wounds and Ulcer. HEENT Present- Wears glasses/contact lenses. Not Present- Earache, Hearing Loss, Hoarseness, Nose Bleed, Oral Ulcers, Ringing in the Ears, Seasonal Allergies, Sinus Pain, Sore Throat, Visual Disturbances and Yellow Eyes. Respiratory Present- Snoring. Not Present- Bloody  sputum, Chronic Cough, Difficulty Breathing and Wheezing. Breast Not Present- Breast Mass, Breast Pain, Nipple Discharge and Skin Changes. Cardiovascular Not Present- Chest Pain, Difficulty Breathing Lying Down, Leg Cramps, Palpitations, Rapid Heart Rate, Shortness of Breath and Swelling of Extremities. Neurological Not Present- Decreased Memory, Fainting, Headaches, Numbness, Seizures, Tingling, Tremor, Trouble walking and Weakness. Psychiatric Not Present- Anxiety, Bipolar, Change in Sleep Pattern, Depression, Fearful and Frequent crying.   Vitals Carlos Joyce Joyce;  10/10/2014 10:50 AM Weight: 208 lb Height: 69in Body Surface Area: 2.14 m Body Mass Index: 30.72 kg/m Temp.: 98.36F(Oral)  Pulse: 86 (Regular)  BP: 128/82 (Sitting, Left Arm, Standard)    Physical Exam (Carlos Maya A. Magnus Ivan MD;  General Mental Status-Alert. General Appearance-Consistent with stated age. Hydration-Well hydrated. Voice-Normal.  Head and Neck Head-normocephalic, atraumatic with no lesions or palpable masses. Trachea-midline.  Eye Eyeball - Bilateral-Extraocular movements intact. Sclera/Conjunctiva - Bilateral-No scleral icterus.  Chest and Lung Exam Chest and lung exam reveals -quiet, even and easy respiratory effort with no use of accessory muscles and on auscultation, normal breath sounds, no adventitious sounds and normal vocal resonance. Inspection Chest Wall - Normal. Back - normal.  Cardiovascular Cardiovascular examination reveals -normal heart sounds, regular rate and rhythm with no murmurs and normal pedal pulses bilaterally.  Abdomen Inspection Skin - Scar - no surgical scars. Hernias - Umbilical hernia - Reducible. Inguinal hernia - Left - Reducible. Right - Reducible. Palpation/Percussion Palpation and Percussion of the abdomen reveal - Soft, Non Tender, No Rebound tenderness, No Rigidity (guarding) and No  hepatosplenomegaly. Auscultation Auscultation of the abdomen reveals - Bowel sounds normal.  Neurologic Neurologic evaluation reveals -alert and oriented x 3 with no impairment of recent or remote memory. Mental Status-Normal.  Musculoskeletal Normal Exam - Left-Upper Extremity Strength Normal and Lower Extremity Strength Normal. Normal Exam -  Right-Upper Extremity Strength Normal, Lower Extremity Weakness.    Assessment & Plan (Latora Quarry A. Magnus Ivan MD; BILATERAL INGUINAL HERNIA (550.92  K40.20) Impression: He has an easily reducible moderate-sized right inguinal hernia as well as a tiny left inguinal hernia and small umbilical hernia. I discussed the diagnosis with him in detail. I recommended laparoscopic bilateral inguinal hernia repair with mesh as well as umbilical hernia repair with possible mesh. I discussed the surgical procedure with him in detail. We gave him literature regarding surgery. I discussed the risks of surgery which includes but is not limited to bleeding, infection, nerve entrapment, chronic pain, recurrent hernia, cardiopulmonary issues, etc. They understand and was to proceed with surgery Current Plans  Pt Education - Pamphlet Given - Laparoscopic Hernia Repair: discussed with patient and provided information. UMBILICAL HERNIA (553.1  K42.9)

## 2014-12-24 ENCOUNTER — Ambulatory Visit (HOSPITAL_BASED_OUTPATIENT_CLINIC_OR_DEPARTMENT_OTHER): Payer: 59 | Admitting: Certified Registered"

## 2014-12-24 ENCOUNTER — Encounter (HOSPITAL_BASED_OUTPATIENT_CLINIC_OR_DEPARTMENT_OTHER): Payer: Self-pay | Admitting: *Deleted

## 2014-12-24 ENCOUNTER — Encounter (HOSPITAL_BASED_OUTPATIENT_CLINIC_OR_DEPARTMENT_OTHER)
Admission: RE | Disposition: A | Discharge status: Home / Self Care | Payer: Self-pay | Source: Ambulatory Visit | Attending: Surgery

## 2014-12-24 ENCOUNTER — Ambulatory Visit (HOSPITAL_BASED_OUTPATIENT_CLINIC_OR_DEPARTMENT_OTHER)
Admission: RE | Admit: 2014-12-24 | Discharge: 2014-12-24 | Disposition: A | Discharge status: Home / Self Care | Payer: 59 | Source: Ambulatory Visit | Attending: Surgery | Admitting: Surgery

## 2014-12-24 DIAGNOSIS — I129 Hypertensive chronic kidney disease with stage 1 through stage 4 chronic kidney disease, or unspecified chronic kidney disease: Secondary | ICD-10-CM | POA: Insufficient documentation

## 2014-12-24 DIAGNOSIS — K429 Umbilical hernia without obstruction or gangrene: Secondary | ICD-10-CM | POA: Insufficient documentation

## 2014-12-24 DIAGNOSIS — E1122 Type 2 diabetes mellitus with diabetic chronic kidney disease: Secondary | ICD-10-CM | POA: Insufficient documentation

## 2014-12-24 DIAGNOSIS — K4020 Bilateral inguinal hernia, without obstruction or gangrene, not specified as recurrent: Secondary | ICD-10-CM | POA: Insufficient documentation

## 2014-12-24 DIAGNOSIS — N189 Chronic kidney disease, unspecified: Secondary | ICD-10-CM | POA: Insufficient documentation

## 2014-12-24 DIAGNOSIS — I739 Peripheral vascular disease, unspecified: Secondary | ICD-10-CM | POA: Insufficient documentation

## 2014-12-24 DIAGNOSIS — G4730 Sleep apnea, unspecified: Secondary | ICD-10-CM | POA: Insufficient documentation

## 2014-12-24 HISTORY — PX: INGUINAL HERNIA REPAIR: SHX194

## 2014-12-24 HISTORY — PX: UMBILICAL HERNIA REPAIR: SHX196

## 2014-12-24 HISTORY — DX: Sleep apnea, unspecified: G47.30

## 2014-12-24 HISTORY — DX: Bilateral inguinal hernia, without obstruction or gangrene, recurrent: K40.21

## 2014-12-24 LAB — GLUCOSE, CAPILLARY
GLUCOSE-CAPILLARY: 127 mg/dL — AB (ref 65–99)
GLUCOSE-CAPILLARY: 147 mg/dL — AB (ref 65–99)

## 2014-12-24 SURGERY — REPAIR, HERNIA, INGUINAL, BILATERAL, LAPAROSCOPIC
Anesthesia: Regional | Site: Abdomen

## 2014-12-24 MED ORDER — KETOROLAC TROMETHAMINE 30 MG/ML IJ SOLN
INTRAMUSCULAR | Status: AC
  Filled 2014-12-24: qty 1

## 2014-12-24 MED ORDER — FENTANYL CITRATE (PF) 100 MCG/2ML IJ SOLN
50.0000 ug | INTRAMUSCULAR | Status: DC | PRN
  Administered 2014-12-24: 100 ug via INTRAVENOUS

## 2014-12-24 MED ORDER — NEOSTIGMINE METHYLSULFATE 10 MG/10ML IV SOLN
INTRAVENOUS | Status: DC | PRN
  Administered 2014-12-24: 4 mg via INTRAVENOUS

## 2014-12-24 MED ORDER — SUCCINYLCHOLINE CHLORIDE 20 MG/ML IJ SOLN
INTRAMUSCULAR | Status: DC | PRN
Start: 2014-12-24 — End: 2014-12-24
  Administered 2014-12-24: 100 mg via INTRAVENOUS

## 2014-12-24 MED ORDER — OXYCODONE-ACETAMINOPHEN 5-325 MG PO TABS: 1.0000 | ORAL_TABLET | ORAL | Status: DC | PRN

## 2014-12-24 MED ORDER — ACETAMINOPHEN 325 MG PO TABS: 650.0000 mg | ORAL_TABLET | ORAL | Status: DC | PRN

## 2014-12-24 MED ORDER — SODIUM CHLORIDE 0.9 % IJ SOLN: 3.0000 mL | INTRAMUSCULAR | Status: DC | PRN

## 2014-12-24 MED ORDER — MIDAZOLAM HCL 2 MG/2ML IJ SOLN
INTRAMUSCULAR | Status: AC
  Filled 2014-12-24: qty 4

## 2014-12-24 MED ORDER — NEOSTIGMINE METHYLSULFATE 10 MG/10ML IV SOLN
INTRAVENOUS | Status: AC
  Filled 2014-12-24: qty 1

## 2014-12-24 MED ORDER — DEXAMETHASONE SODIUM PHOSPHATE 4 MG/ML IJ SOLN
INTRAMUSCULAR | Status: DC | PRN
  Administered 2014-12-24: 4 mg via INTRAVENOUS

## 2014-12-24 MED ORDER — KETOROLAC TROMETHAMINE 30 MG/ML IJ SOLN
INTRAMUSCULAR | Status: DC | PRN
  Administered 2014-12-24: 30 mg via INTRAVENOUS

## 2014-12-24 MED ORDER — PROPOFOL 10 MG/ML IV BOLUS
INTRAVENOUS | Status: DC | PRN
  Administered 2014-12-24: 200 mg via INTRAVENOUS

## 2014-12-24 MED ORDER — PHENYLEPHRINE HCL 10 MG/ML IJ SOLN
INTRAMUSCULAR | Status: DC | PRN
  Administered 2014-12-24 (×3): 40 ug via INTRAVENOUS

## 2014-12-24 MED ORDER — PROPOFOL 500 MG/50ML IV EMUL
INTRAVENOUS | Status: AC
  Filled 2014-12-24: qty 50

## 2014-12-24 MED ORDER — GLYCOPYRROLATE 0.2 MG/ML IJ SOLN
INTRAMUSCULAR | Status: AC
  Filled 2014-12-24: qty 2

## 2014-12-24 MED ORDER — HYDROMORPHONE HCL 1 MG/ML IJ SOLN
INTRAMUSCULAR | Status: AC
  Filled 2014-12-24: qty 1

## 2014-12-24 MED ORDER — MEPERIDINE HCL 25 MG/ML IJ SOLN: 6.2500 mg | INTRAMUSCULAR | Status: DC | PRN

## 2014-12-24 MED ORDER — LIDOCAINE HCL (CARDIAC) 20 MG/ML IV SOLN
INTRAVENOUS | Status: AC
  Filled 2014-12-24: qty 5

## 2014-12-24 MED ORDER — BUPIVACAINE HCL (PF) 0.5 % IJ SOLN
INTRAMUSCULAR | Status: AC
  Filled 2014-12-24: qty 30

## 2014-12-24 MED ORDER — DEXAMETHASONE SODIUM PHOSPHATE 10 MG/ML IJ SOLN
INTRAMUSCULAR | Status: AC
  Filled 2014-12-24: qty 1

## 2014-12-24 MED ORDER — GLYCOPYRROLATE 0.2 MG/ML IJ SOLN
0.2000 mg | Freq: Once | INTRAMUSCULAR | Status: AC | PRN
  Administered 2014-12-24: 0.4 mg via INTRAVENOUS

## 2014-12-24 MED ORDER — FENTANYL CITRATE (PF) 100 MCG/2ML IJ SOLN
INTRAMUSCULAR | Status: AC
  Filled 2014-12-24: qty 4

## 2014-12-24 MED ORDER — MORPHINE SULFATE (PF) 2 MG/ML IV SOLN: 1.0000 mg | INTRAVENOUS | Status: DC | PRN

## 2014-12-24 MED ORDER — PROMETHAZINE HCL 25 MG/ML IJ SOLN
6.2500 mg | INTRAMUSCULAR | Status: DC | PRN
Start: 2014-12-24 — End: 2014-12-24

## 2014-12-24 MED ORDER — CEFAZOLIN SODIUM-DEXTROSE 2-3 GM-% IV SOLR
INTRAVENOUS | Status: AC
  Filled 2014-12-24: qty 50

## 2014-12-24 MED ORDER — HYDROMORPHONE HCL 1 MG/ML IJ SOLN
0.2500 mg | INTRAMUSCULAR | Status: DC | PRN
  Administered 2014-12-24 (×2): 0.5 mg via INTRAVENOUS

## 2014-12-24 MED ORDER — SODIUM CHLORIDE 0.9 % IV SOLN: 250.0000 mL | INTRAVENOUS | Status: DC | PRN

## 2014-12-24 MED ORDER — ROCURONIUM BROMIDE 100 MG/10ML IV SOLN
INTRAVENOUS | Status: DC | PRN
  Administered 2014-12-24: 40 mg via INTRAVENOUS

## 2014-12-24 MED ORDER — MIDAZOLAM HCL 2 MG/2ML IJ SOLN
1.0000 mg | INTRAMUSCULAR | Status: DC | PRN
  Administered 2014-12-24: 2 mg via INTRAVENOUS

## 2014-12-24 MED ORDER — BUPIVACAINE-EPINEPHRINE (PF) 0.5% -1:200000 IJ SOLN
INTRAMUSCULAR | Status: AC
  Filled 2014-12-24: qty 30

## 2014-12-24 MED ORDER — ACETAMINOPHEN 650 MG RE SUPP: 650.0000 mg | RECTAL | Status: DC | PRN

## 2014-12-24 MED ORDER — PHENYLEPHRINE 40 MCG/ML (10ML) SYRINGE FOR IV PUSH (FOR BLOOD PRESSURE SUPPORT)
PREFILLED_SYRINGE | INTRAVENOUS | Status: AC
  Filled 2014-12-24: qty 10

## 2014-12-24 MED ORDER — LIDOCAINE HCL (CARDIAC) 20 MG/ML IV SOLN
INTRAVENOUS | Status: DC | PRN
  Administered 2014-12-24: 50 mg via INTRAVENOUS

## 2014-12-24 MED ORDER — LACTATED RINGERS IV SOLN
INTRAVENOUS | Status: DC
  Administered 2014-12-24 (×2): via INTRAVENOUS

## 2014-12-24 MED ORDER — ONDANSETRON HCL 4 MG/2ML IJ SOLN
INTRAMUSCULAR | Status: DC | PRN
  Administered 2014-12-24: 4 mg via INTRAVENOUS

## 2014-12-24 MED ORDER — ONDANSETRON HCL 4 MG/2ML IJ SOLN
INTRAMUSCULAR | Status: AC
  Filled 2014-12-24: qty 2

## 2014-12-24 MED ORDER — SCOPOLAMINE 1 MG/3DAYS TD PT72: 1.0000 | MEDICATED_PATCH | Freq: Once | TRANSDERMAL | Status: DC | PRN

## 2014-12-24 MED ORDER — CEFAZOLIN SODIUM-DEXTROSE 2-3 GM-% IV SOLR
2.0000 g | INTRAVENOUS | Status: AC
  Administered 2014-12-24: 2 g via INTRAVENOUS

## 2014-12-24 MED ORDER — BUPIVACAINE-EPINEPHRINE (PF) 0.25% -1:200000 IJ SOLN
INTRAMUSCULAR | Status: DC | PRN
Start: 2014-12-24 — End: 2014-12-24
  Administered 2014-12-24: 20 mL

## 2014-12-24 MED ORDER — OXYCODONE HCL 5 MG PO TABS: 5.0000 mg | ORAL_TABLET | ORAL | Status: DC | PRN

## 2014-12-24 MED ORDER — SODIUM CHLORIDE 0.9 % IJ SOLN: 3.0000 mL | Freq: Two times a day (BID) | INTRAMUSCULAR | Status: DC

## 2014-12-24 SURGICAL SUPPLY — 56 items
APPLIER CLIP LOGIC TI 5 (MISCELLANEOUS) IMPLANT
BLADE CLIPPER SURG (BLADE) ×4 IMPLANT
BLADE HEX COATED 2.75 (ELECTRODE) IMPLANT
BLADE SURG 15 STRL LF DISP TIS (BLADE) ×2 IMPLANT
BLADE SURG 15 STRL SS (BLADE) ×2
CANISTER SUCT 1200ML W/VALVE (MISCELLANEOUS) IMPLANT
CHLORAPREP W/TINT 26ML (MISCELLANEOUS) ×4 IMPLANT
COVER BACK TABLE 60X90IN (DRAPES) IMPLANT
COVER MAYO STAND STRL (DRAPES) ×4 IMPLANT
DECANTER SPIKE VIAL GLASS SM (MISCELLANEOUS) IMPLANT
DEVICE SECURE STRAP 25 ABSORB (INSTRUMENTS) ×4 IMPLANT
DISSECT BALLN SPACEMKR + OVL (BALLOONS) ×4
DISSECTOR BALLN SPACEMKR + OVL (BALLOONS) ×2 IMPLANT
DISSECTOR BLUNT TIP ENDO 5MM (MISCELLANEOUS) IMPLANT
DRAPE LAPAROTOMY 100X72 PEDS (DRAPES) IMPLANT
DRAPE UTILITY XL STRL (DRAPES) ×4 IMPLANT
DRSG TEGADERM 2-3/8X2-3/4 SM (GAUZE/BANDAGES/DRESSINGS) ×4 IMPLANT
ELECT REM PT RETURN 9FT ADLT (ELECTROSURGICAL) ×4
ELECTRODE REM PT RTRN 9FT ADLT (ELECTROSURGICAL) ×2 IMPLANT
GLOVE BIOGEL PI IND STRL 7.0 (GLOVE) ×2 IMPLANT
GLOVE BIOGEL PI INDICATOR 7.0 (GLOVE) ×2
GLOVE SURG SIGNA 7.5 PF LTX (GLOVE) ×4 IMPLANT
GLOVE SURG SS PI 6.5 STRL IVOR (GLOVE) ×8 IMPLANT
GOWN STRL REUS W/ TWL LRG LVL3 (GOWN DISPOSABLE) ×4 IMPLANT
GOWN STRL REUS W/ TWL XL LVL3 (GOWN DISPOSABLE) ×2 IMPLANT
GOWN STRL REUS W/TWL LRG LVL3 (GOWN DISPOSABLE) ×4
GOWN STRL REUS W/TWL XL LVL3 (GOWN DISPOSABLE) ×2
LIQUID BAND (GAUZE/BANDAGES/DRESSINGS) ×8 IMPLANT
MESH 3DMAX 4X6 LT LRG (Mesh General) ×4 IMPLANT
MESH 3DMAX 4X6 RT LRG (Mesh General) ×4 IMPLANT
NEEDLE HYPO 25X1 1.5 SAFETY (NEEDLE) ×4 IMPLANT
NEEDLE INSUFFLATION 14GA 120MM (NEEDLE) IMPLANT
NS IRRIG 1000ML POUR BTL (IV SOLUTION) ×4 IMPLANT
PACK BASIN DAY SURGERY FS (CUSTOM PROCEDURE TRAY) ×4 IMPLANT
PENCIL BUTTON HOLSTER BLD 10FT (ELECTRODE) ×4 IMPLANT
SET IRRIG TUBING LAPAROSCOPIC (IRRIGATION / IRRIGATOR) IMPLANT
SET TROCAR LAP APPLE-HUNT 5MM (ENDOMECHANICALS) ×4 IMPLANT
SLEEVE SCD COMPRESS KNEE MED (MISCELLANEOUS) ×4 IMPLANT
SPONGE LAP 4X18 X RAY DECT (DISPOSABLE) IMPLANT
SUT MNCRL AB 4-0 PS2 18 (SUTURE) ×4 IMPLANT
SUT NOVA 0 T19/GS 22DT (SUTURE) IMPLANT
SUT NOVA NAB DX-16 0-1 5-0 T12 (SUTURE) IMPLANT
SUT PROLENE 1 CT (SUTURE) ×4 IMPLANT
SUT VIC AB 2-0 SH 27 (SUTURE)
SUT VIC AB 2-0 SH 27XBRD (SUTURE) IMPLANT
SUT VIC AB 3-0 SH 27 (SUTURE) ×4
SUT VIC AB 3-0 SH 27X BRD (SUTURE) ×4 IMPLANT
SYR CONTROL 10ML LL (SYRINGE) ×4 IMPLANT
TOWEL OR 17X24 6PK STRL BLUE (TOWEL DISPOSABLE) ×4 IMPLANT
TOWEL OR NON WOVEN STRL DISP B (DISPOSABLE) ×4 IMPLANT
TRAY FOLEY CATH SILVER 16FR (SET/KITS/TRAYS/PACK) ×4 IMPLANT
TRAY LAPAROSCOPIC (CUSTOM PROCEDURE TRAY) ×4 IMPLANT
TUBE CONNECTING 20'X1/4 (TUBING)
TUBE CONNECTING 20X1/4 (TUBING) IMPLANT
TUBING INSUFFLATION (TUBING) ×4 IMPLANT
YANKAUER SUCT BULB TIP NO VENT (SUCTIONS) IMPLANT

## 2014-12-24 NOTE — Anesthesia Preprocedure Evaluation (Signed)
Anesthesia Evaluation  Patient identified by MRN, date of birth, ID band Patient awake    Reviewed: Allergy & Precautions, NPO status , Patient's Chart, lab work & pertinent test results, reviewed documented beta blocker date and time   Airway Mallampati: II  TM Distance: >3 FB Neck ROM: Full    Dental no notable dental hx.    Pulmonary sleep apnea ,    Pulmonary exam normal breath sounds clear to auscultation       Cardiovascular hypertension, Pt. on home beta blockers and Pt. on medications + Peripheral Vascular Disease  negative cardio ROS Normal cardiovascular exam Rhythm:Regular Rate:Normal     Neuro/Psych negative neurological ROS  negative psych ROS   GI/Hepatic Neg liver ROS, PUD,   Endo/Other  diabetes, Type 2  Renal/GU CRF and Renal InsufficiencyRenal disease  negative genitourinary   Musculoskeletal negative musculoskeletal ROS (+)   Abdominal   Peds  Hematology negative hematology ROS (+)   Anesthesia Other Findings   Reproductive/Obstetrics negative OB ROS                             Anesthesia Physical Anesthesia Plan  ASA: III  Anesthesia Plan: General and Regional   Post-op Pain Management: GA combined w/ Regional for post-op pain   Induction: Intravenous  Airway Management Planned: Oral ETT  Additional Equipment:   Intra-op Plan:   Post-operative Plan: Extubation in OR  Informed Consent: I have reviewed the patients History and Physical, chart, labs and discussed the procedure including the risks, benefits and alternatives for the proposed anesthesia with the patient or authorized representative who has indicated his/her understanding and acceptance.   Dental advisory given  Plan Discussed with: CRNA  Anesthesia Plan Comments:         Anesthesia Quick Evaluation

## 2014-12-24 NOTE — Anesthesia Postprocedure Evaluation (Signed)
Anesthesia Post Note  Patient: Carlos Joyce  Procedure(s) Performed: Procedure(s) (LRB): LAPAROSCOPIC BILATERAL INGUINAL HERNIA WITH MESH  (Bilateral) HERNIA REPAIR UMBILICAL ADULT (N/A)  Anesthesia type: General  Patient location: PACU  Post pain: Pain level controlled  Post assessment: Post-op Vital signs reviewed  Last Vitals: BP 106/68 mmHg  Pulse 83  Temp(Src) 36.8 C (Oral)  Resp 16  Ht  (1.727 m)  Wt 203 lb (92.08 kg)  BMI 30.87 kg/m2  SpO2 98%  Post vital signs: Reviewed  Level of consciousness: sedated  Complications: No apparent anesthesia complications

## 2014-12-24 NOTE — Anesthesia Procedure Notes (Signed)
Procedure Name: Intubation Date/Time: 12/24/2014 8:19 AM Performed by: Caren Macadam Pre-anesthesia Checklist: Patient identified, Emergency Drugs available, Suction available and Patient being monitored Patient Re-evaluated:Patient Re-evaluated prior to inductionOxygen Delivery Method: Circle System Utilized Preoxygenation: Pre-oxygenation with 100% oxygen Intubation Type: IV induction Ventilation: Mask ventilation without difficulty Laryngoscope Size: Miller and 2 Grade View: Grade I Tube type: Oral Tube size: 8.0 mm Number of attempts: 1 Airway Equipment and Method: Stylet and Oral airway Placement Confirmation: ETT inserted through vocal cords under direct vision,  positive ETCO2 and breath sounds checked- equal and bilateral Secured at: 22 cm Tube secured with: Tape Dental Injury: Teeth and Oropharynx as per pre-operative assessment

## 2014-12-24 NOTE — Op Note (Signed)
LAPAROSCOPIC BILATERAL INGUINAL HERNIA WITH MESH , HERNIA REPAIR UMBILICAL ADULT  Procedure Note  MENDEL BINSFELD 12/24/2014   Pre-op Diagnosis:  Bilateral Inguinal Hernia, Umbilical Hernia     Post-op Diagnosis: same  Procedure(s): LAPAROSCOPIC BILATERAL INGUINAL HERNIA WITH MESH  HERNIA REPAIR UMBILICAL ADULT  Surgeon(s): Abigail Miyamoto, MD  Anesthesia: General  Staff:  Circulator: Maryan Rued, RN Scrub Person: Bernita Raisin, RN Circulator Assistant: Raliegh Scarlet, RN  Estimated Blood Loss: Minimal                         Carlos Joyce   Date: 12/24/2014  Time: 9:25 AM

## 2014-12-24 NOTE — Interval H&P Note (Signed)
History and Physical Interval Note: no change in H and P  12/24/2014 7:37 AM  Carlos Joyce  has presented today for surgery, with the diagnosis of  Bilateral Inguinal Hernia, Umbilical Hernia  The various methods of treatment have been discussed with the patient and family. After consideration of risks, benefits and other options for treatment, the patient has consented to  Procedure(s): LAPAROSCOPIC BILATERAL INGUINAL HERNIA WITH UMBILICAL HERNIA AND MESH (Bilateral) HERNIA REPAIR UMBILICAL ADULT (N/A) as a surgical intervention .  The patient's history has been reviewed, patient examined, no change in status, stable for surgery.  I have reviewed the patient's chart and labs.  Questions were answered to the patient's satisfaction.     [name] A

## 2014-12-24 NOTE — Discharge Instructions (Signed)
[facility] _______[facility]  UMBILICAL OR INGUINAL HERNIA REPAIR: POST OP INSTRUCTIONS  Always review your discharge instruction sheet given to you by the facility where your surgery was performed. IF YOU HAVE DISABILITY OR FAMILY LEAVE FORMS, YOU MUST BRING THEM TO THE OFFICE FOR PROCESSING.   DO NOT GIVE THEM TO YOUR DOCTOR.  1. A  prescription for pain medication may be given to you upon discharge.  Take your pain medication as prescribed, if needed.  If narcotic pain medicine is not needed, then you may take acetaminophen (Tylenol) or ibuprofen (Advil) as needed. 2. Take your usually prescribed medications unless otherwise directed. 3. If you need a refill on your pain medication, please contact your pharmacy.  They will contact our office to request authorization. Prescriptions will not be filled after 5 pm or on week-ends. 4. You should follow a light diet the first 24 hours after arrival home, such as soup and crackers, etc.  Be sure to include lots of fluids daily.  Resume your normal diet the day after surgery. 5. Most patients will experience some swelling and bruising around the umbilicus or in the groin and scrotum.  Ice packs and reclining will help.  Swelling and bruising can take several days to resolve.  6. It is common to experience some constipation if taking pain medication after surgery.  Increasing fluid intake and taking a stool softener (such as Colace) will usually help or prevent this problem from occurring.  A mild laxative (Milk of Magnesia or Miralax) should be taken according to package directions if there are no bowel movements after 48 hours. 7. Unless discharge instructions indicate otherwise, you may remove your bandages 24-48 hours after surgery, and you may shower at that time.  You may have steri-strips (small skin tapes) in place directly over the incision.  These strips should be left on the skin for 7-10 days.  If your surgeon used skin glue on the  incision, you may shower in 24 hours.  The glue will flake off over the next 2-3 weeks.  Any sutures or staples will be removed at the office during your follow-up visit. 8. ACTIVITIES:  You may resume regular (light) daily activities beginning the next day--such as daily self-care, walking, climbing stairs--gradually increasing activities as tolerated.  You may have sexual intercourse when it is comfortable.  Refrain from any heavy lifting or straining until approved by your doctor. a. You may drive when you are no longer taking prescription pain medication, you can comfortably wear a seatbelt, and you can safely maneuver your car and apply brakes. b. RETURN TO WORK:  __________________________________________________________ 9. You should see your doctor in the office for a follow-up appointment approximately 2-3 weeks after your surgery.  Make sure that you call for this appointment within a day or two after you arrive home to insure a convenient appointment time. 10. OTHER INSTRUCTIONS: OK TO SHOWER TOMORROW 11. ICE PACK AND IBUPROFEN ALSO FOR PAIN 12. NO LIFTING MORE THAN 15 POUNDS UNTIL YOU SEE ME AGAIN IN THE OFFICE __________________________________________________________________________________________________________________________________________________________________________________________  WHEN TO CALL YOUR DOCTOR: 1. Fever over 101.0 2. Inability to urinate 3. Nausea and/or vomiting 4. Extreme swelling or bruising 5. Continued bleeding from incision. 6. Increased pain, redness, or drainage from the incision  The clinic staff is available to answer your questions during regular business hours.  Please dont hesitate to call and ask to speak to one of the nurses for clinical concerns.  If you have a medical  emergency, go to the nearest emergency room or call 911.  A surgeon from [facility] is always on call at the hospital   [address],  [place], [place]  [address] ?  [address] [address], [place], [place]   [address] [phone] ? [phone] ? FAX [phone] Web site: [email or web address]    Post Anesthesia Home Care Instructions  Activity: Get plenty of rest for the remainder of the day. A responsible adult should stay with you for 24 hours following the procedure.  For the next 24 hours, DO NOT: -Drive a car -Advertising copywriter -Drink alcoholic beverages -Take any medication unless instructed by your physician -Make any legal decisions or sign important papers.  Meals: Start with liquid foods such as gelatin or soup. Progress to regular foods as tolerated. Avoid greasy, spicy, heavy foods. If nausea and/or vomiting occur, drink only clear liquids until the nausea and/or vomiting subsides. Call your physician if vomiting continues.  Special Instructions/Symptoms: Your throat may feel dry or sore from the anesthesia or the breathing tube placed in your throat during surgery. If this causes discomfort, gargle with warm salt water. The discomfort should disappear within 24 hours.  If you had a scopolamine patch placed behind your ear for the management of post- operative nausea and/or vomiting:  1. The medication in the patch is effective for 72 hours, after which it should be removed.  Wrap patch in a tissue and discard in the trash. Wash hands thoroughly with soap and water. 2. You may remove the patch earlier than 72 hours if you experience unpleasant side effects which may include dry mouth, dizziness or visual disturbances. 3. Avoid touching the patch. Wash your hands with soap and water after contact with the patch.

## 2014-12-24 NOTE — Transfer of Care (Signed)
Immediate Anesthesia Transfer of Care Note  Patient: Carlos Joyce  Procedure(s) Performed: Procedure(s): LAPAROSCOPIC BILATERAL INGUINAL HERNIA WITH MESH  (Bilateral) HERNIA REPAIR UMBILICAL ADULT (N/A)  Patient Location: PACU  Anesthesia Type:General  Level of Consciousness: awake, alert  and oriented  Airway & Oxygen Therapy: Patient Spontanous Breathing and Patient connected to face mask oxygen  Post-op Assessment: Report given to RN and Post -op Vital signs reviewed and stable  Post vital signs: Reviewed and stable  Last Vitals:  Filed Vitals:   12/24/14 0731  BP: 119/81  Pulse: 85  Temp: 36.9 C  Resp: 20    Complications: No apparent anesthesia complications

## 2014-12-25 ENCOUNTER — Encounter (HOSPITAL_BASED_OUTPATIENT_CLINIC_OR_DEPARTMENT_OTHER): Payer: Self-pay | Admitting: Surgery

## 2014-12-25 NOTE — Op Note (Signed)
NAMETZVI, ECONOMOU NO.:  [id number]  MEDICAL RECORD NO.:  [id number]  LOCATION:                               FACILITY:  [facility]  PHYSICIAN:  Abigail Miyamoto, M.D. DATE OF BIRTH:  01-07-1961  DATE OF PROCEDURE:  12/24/2014 DATE OF DISCHARGE:  12/24/2014                              OPERATIVE REPORT   POSTOPERATIVE DIAGNOSES:  Bilateral inguinal hernias and umbilical hernia.  POSTOPERATIVE DIAGNOSES:  Bilateral inguinal hernias and umbilical hernia.  PROCEDURES: 1. Laparoscopic bilateral inguinal hernia repair with mesh. 2. Umbilical hernia repair.  SURGEON:  Abigail Miyamoto, M.D.  ANESTHESIA:  General and 0.5% Marcaine with epinephrine.  ESTIMATED BLOOD LOSS:  Minimal.  FINDINGS:  The patient was found to have bilateral direct inguinal hernias which were repaired with 2 separate pieces of Bard 3DMax Prolene mesh.  The umbilical hernia was repaired primarily.  PROCEDURE IN DETAIL:  The patient was brought to the operating room, identified as Carlos Joyce.  He was placed supine on the operating room table, and general anesthesia was induced.  A Foley catheter was then inserted.  His abdomen was then prepped and draped in usual sterile fashion.  I made a small transverse incision at the lower edge of the umbilicus with a scalpel.  I took this down to the fascia which was opened just to the right of the midline.  The rectus muscles were identified and elevated.  The dissecting balloon was then passed underneath the rectus sheath and manipulated towards the pubis. Insufflation of the dissecting balloon was then begun under direct vision.  The dissecting balloon dissected out the preperitoneal space well.  I then removed the dissecting balloon and began insufflation with carbon dioxide.  I then placed two 5-mm ports in the patient's lower midline both under direct vision after incision with a scalpel.  I dissected out the right inguinal area first.  The  patient had a large direct hernia defect.  I was able to easily reduce the sac completely. I then evaluated the testicular cords and dissected free the peritoneum and saw no evidence of indirect hernia.  I then turned my attention towards the left inguinal area, and the patient had a smaller direct hernia defect there as well.  There were no attachments to the direct hernia.  I evaluated the cord structures and found no evidence of indirect hernia.  I then brought a left-sided piece of Bard 3DMax Prolene mesh onto the field.  I placed it through the umbilical port and opened it as an onlay on the left inguinal floor.  I then tacked it with absorbable tacks to Cooper's ligament up the medial abdominal wall and slightly laterally.  Wide coverage of the direct defect, cord structures appeared to be achieved.  I then brought a right-sided piece of Bard 3DMax Prolene mesh onto the field.  I placed it through the umbilical port and then opened as an onlay on the right inguinal floor.  I then tacked it to Cooper's ligament up the medial abdominal wall and slightly laterally.  Again good coverage of the right-sided direct defect appeared to be achieved as well.  Hemostasis also appeared to be  achieved.  At this point, I removed the ports under direct vision in the midline and deflated the preperitoneal space.  I then separated the overlying umbilical skin from the small fascial defect at the umbilicus. This was a small less than 1 cm defect.  I repaired the defect with a single figure-of-eight #1 Prolene suture.  I closed the small fascial defect from the incision below this with a figure-of-eight 0 Vicryl suture.  I then anesthetized all incisions with Marcaine and performed bilateral ilioinguinal nerve blocks with Marcaine as well.  I tacked the umbilical skin back in place with 3-0 Vicryl sutures.  I then closed all incisions with 4-0 Monocryl and skin glue.  The patient tolerated  the procedure well.  All the counts were correct at the end of the procedure.  The patient was then extubated in the operating room and taken in stable condition to the recovery room.     Abigail Miyamoto, M.D.     DB/MEDQ  D:  12/24/2014  T:  12/25/2014  Job:  [id number]

## 2015-01-08 ENCOUNTER — Encounter: Payer: Self-pay | Admitting: *Deleted

## 2015-01-08 NOTE — Progress Notes (Signed)
Healthcare Advance Directives Clinical Social Work  Clinical Social Work was referred by pt  to review and complete healthcare advance directives.  Clinical Social Worker met with patient and his wife, Carlos Joyce in [place] office.  The patient designated [name] as their primary healthcare agent and [name], daughter as their secondary agent.  Patient also completed healthcare living will.    Clinical Social Worker notarized documents and made copies for patient/family. Clinical Social Worker will send documents to medical records to be scanned into patient's chart. Clinical Social Worker encouraged patient/family to contact with any additional questions or concerns.  Loren Racer, [place] Worker [place]  [place] Phone: [phone] Fax: [phone]

## 2015-02-13 ENCOUNTER — Other Ambulatory Visit: Payer: Self-pay | Admitting: Surgery

## 2015-02-13 DIAGNOSIS — I719 Aortic aneurysm of unspecified site, without rupture: Secondary | ICD-10-CM

## 2015-02-16 ENCOUNTER — Other Ambulatory Visit: Payer: Self-pay | Admitting: Surgery

## 2015-02-16 DIAGNOSIS — I719 Aortic aneurysm of unspecified site, without rupture: Secondary | ICD-10-CM

## 2015-03-06 LAB — CREATININE, ISTAT: Creatinine, IStat: 1.8 mg/dL — ABNORMAL HIGH (ref 0.6–1.3)

## 2015-03-11 ENCOUNTER — Ambulatory Visit
Admission: RE | Admit: 2015-03-11 | Discharge: 2015-03-11 | Disposition: A | Discharge status: Home / Self Care | Payer: 59 | Source: Ambulatory Visit | Attending: Surgery | Admitting: Surgery

## 2015-03-11 ENCOUNTER — Ambulatory Visit (INDEPENDENT_AMBULATORY_CARE_PROVIDER_SITE_OTHER): Payer: 59 | Admitting: Surgery

## 2015-03-11 ENCOUNTER — Encounter: Payer: Self-pay | Admitting: Surgery

## 2015-03-11 VITALS — BP 118/76 | HR 67 | Resp 20 | Ht 68.0 in | Wt 203.0 lb

## 2015-03-11 DIAGNOSIS — I712 Thoracic aortic aneurysm, without rupture, unspecified: Secondary | ICD-10-CM

## 2015-03-11 DIAGNOSIS — I719 Aortic aneurysm of unspecified site, without rupture: Secondary | ICD-10-CM

## 2015-03-11 NOTE — Progress Notes (Signed)
HPI:  The patient returns today for follow up of an ascending aortic aneurysm. When I saw him one year ago it measured 4.4 cm and had increased in size from 4.2 cm the year before that.  His initial CT scan was done in 02/2002 and the aorta was 4.2 cm at that time. Since last year he has continued to do well and denies any chest or back pain. He did have laparoscopic inguinal and umbilical hernia repair in September and did well with that.   Current Outpatient Prescriptions  Medication Sig Dispense Refill  . Albiglutide (TANZEUM) 50 MG PEN Inject 50 Units into the skin once a week.    Marland Kitchen. atenolol (TENORMIN) 50 MG tablet Take 50 mg by mouth daily.     . fenofibrate 160 MG tablet Take 160 mg by mouth daily.    . simvastatin (ZOCOR) 40 MG tablet Take 40 mg by mouth daily.    Marland Kitchen. telmisartan (MICARDIS) 40 MG tablet Take 1/2 tablet once a day    . triamcinolone cream (KENALOG) 0.1 % 1 application to affected area     No current facility-administered medications for this visit.     Physical Exam: BP 118/76 mmHg  Pulse 67  Resp 20  Ht 5\' 8"  (1.727 m)  Wt 203 lb (92.08 kg)  BMI 30.87 kg/m2  SpO2 98% He looks well Cardiac exam shows a regular rate and rhythm with normal heart sounds   Diagnostic Tests:  CLINICAL DATA: Followup of ascending thoracic aortic aneurysm  EXAM: CT CHEST WITHOUT CONTRAST  TECHNIQUE: Multidetector CT imaging of the chest was performed following the standard protocol without IV contrast.  COMPARISON: CT angio chest of 03/12/2014  FINDINGS: On this unenhanced study, there is persistent fusiform dilatation of the ascending aorta. The maximum diameter as measured previously is 4.5 cm compared to 4.4 cm on the prior CT. Recommend followup by abdomen and pelvis CTA in 6 months, and vascular surgery referral/consultation if not already obtained. This recommendation follows ACR consensus guidelines: White Paper of the ACR Incidental Findings  Committee II on Vascular Findings. J Am Coll Radiol 2013; 10:789-794. The thoracic aortic arch is unremarkable as is the descending thoracic colour, both of which are normal in caliber. The pulmonary arteries are unremarkable in the unenhanced state. No mediastinal or hilar adenopathy is seen. Coronary artery calcifications are noted involving all 3 major vessels. There is mild cardiomegaly present. Multiple low-attenuation renal lesions are present consistent with polycystic kidney disease. The thyroid gland is unremarkable.  On lung window images, no lung parenchymal abnormality is seen. No lung nodule or mass is evident. There is no evidence of pleural effusion. The central airway is patent. There are only mild degenerative changes in the mid to lower thoracic spine. No compression deformity is seen.  IMPRESSION: 1. Stable to minimally increased diameter of previously demonstrated ascending thoracic aortic aneurysm now measuring 4.5 cm. Recommend followup by abdomen and pelvis CTA in 6 months, and vascular surgery referral/consultation if not already obtained. This recommendation follows ACR consensus guidelines: White Paper of the ACR Incidental Findings Committee II on Vascular Findings. J Am Coll Radiol 2013; 10:789-794. 2. Three vessel coronary artery disease. 3. Multiple renal cysts consistent with polycystic kidney disease.   Electronically Signed  By: Dwyane DeePaul Barry M.D.  On: 03/11/2015 12:44   Impression:  The ascending aortic aneurysm is stable at 44.5 mm. There was been no significant change over the last year. I reviewed the CT scan with  the patient and his wife and answered their questions. His BP is under good control on Tenormin and Micardis.  Plan:  I will see him in one year with a CT of the chest. He will have it without contrast since he has polycystic kidney disease with an elevated creatinine.   Alleen Borne, MD [facility] [phone]

## 2015-04-21 ENCOUNTER — Encounter: Payer: Self-pay | Admitting: *Deleted

## 2015-04-21 ENCOUNTER — Encounter: Payer: Self-pay | Admitting: Cardiology

## 2015-04-21 ENCOUNTER — Ambulatory Visit (INDEPENDENT_AMBULATORY_CARE_PROVIDER_SITE_OTHER): Payer: 59 | Admitting: Cardiology

## 2015-04-21 VITALS — BP 118/64 | HR 85 | Ht 68.0 in | Wt 206.0 lb

## 2015-04-21 DIAGNOSIS — E785 Hyperlipidemia, unspecified: Secondary | ICD-10-CM

## 2015-04-21 DIAGNOSIS — I700 Atherosclerosis of aorta: Secondary | ICD-10-CM | POA: Diagnosis not present

## 2015-04-21 DIAGNOSIS — I10 Essential (primary) hypertension: Secondary | ICD-10-CM | POA: Diagnosis not present

## 2015-04-21 DIAGNOSIS — I77810 Thoracic aortic ectasia: Secondary | ICD-10-CM

## 2015-04-21 NOTE — Patient Instructions (Signed)
Medication Instructions:  None  Labwork: None  Testing/Procedures: We will plan to repeat your CT scan this November  Follow-Up: Your physician wants you to follow-up in: 1 year with Dr. Anne FuSkains. You will receive a reminder letter in the mail two months in advance. If you don't receive a letter, please call our office to schedule the follow-up appointment.   Any Other Special Instructions Will Be Listed Below (If Applicable).     If you need a refill on your cardiac medications before your next appointment, please call your pharmacy.

## 2015-04-21 NOTE — Progress Notes (Signed)
[address] [place], [place]  [address] Phone: [phone]) [phone] Fax:  [phone]) [phone]  Date:  04/21/2015   ID:  Carlos Joyce, DOB 09/19/1960, MRN [id number]  PCP:  Lupita RaiderSHAW,KIMBERLEE, MD   History of Present Illness: Carlos Joyce is a 55 y.o. male with ascending aortic aneurysm, 4.5 cm 11/16- 4.4 cm seen by Dr. Laneta SimmersBartle in November 2015, 4.2 cm 2005. Close follow-up with CT scan in one year. Blood pressure under good control with atenolol and Micardis.   Overall doing very well. No symptoms. No chest pain, no shortness breath, no syncope, no bleeding. Has polycystic kidney disease. Following with Dr. Briant CedarMattingly.  CT scan also reveals three-vessel coronary artery calcification.  Wt Readings from Last 3 Encounters:  04/21/15 206 lb (93.441 kg)  03/11/15 203 lb (92.08 kg)  12/24/14 203 lb (92.08 kg)     Past Medical History  Diagnosis Date  . Ascending aortic aneurysm (HCC)   . Diabetes mellitus (HCC)   . HBP (high blood pressure)   . Hyperlipidemia   . CKD (chronic kidney disease), stage III   . Polycystic kidney disease   . PUD (peptic ulcer disease)   . Aortic atherosclerosis (HCC) 03/07/2013  . Essential hypertension 03/07/2013  . Hyperlipemia 03/07/2013  . PKD (polycystic kidney disease) 03/22/2013    Creat 1.4 10/14  . Sleep apnea     does not use CPAP  . Inguinal hernia recurrent bilateral     Past Surgical History  Procedure Laterality Date  . Hemorrhoid surgery    . Inguinal hernia repair Bilateral 12/24/2014    Procedure: LAPAROSCOPIC BILATERAL INGUINAL HERNIA WITH MESH ;  Surgeon: Abigail Miyamotoouglas Blackman, MD;  Location: [facility];  Service: General;  Laterality: Bilateral;  . Umbilical hernia repair N/A 12/24/2014    Procedure: HERNIA REPAIR UMBILICAL ADULT;  Surgeon: Abigail Miyamotoouglas Blackman, MD;  Location: [facility];  Service: General;  Laterality: N/A;    Current Outpatient Prescriptions  Medication Sig Dispense Refill  .  Albiglutide (TANZEUM) 50 MG PEN Inject 50 Units into the skin once a week.    Marland Kitchen. atenolol (TENORMIN) 50 MG tablet Take 50 mg by mouth daily.     . fenofibrate 160 MG tablet Take 160 mg by mouth daily.    . simvastatin (ZOCOR) 40 MG tablet Take 40 mg by mouth daily.    Marland Kitchen. telmisartan (MICARDIS) 40 MG tablet Take 1/2 tablet once a day    . triamcinolone cream (KENALOG) 0.1 % 1 application to affected area     No current facility-administered medications for this visit.    Allergies:   Not on File  Social History:  The patient  reports that he has never smoked. He has never used smokeless tobacco. He reports that he does not drink alcohol or use illicit drugs.   Family History  Problem Relation Age of Onset  . Polycystic kidney disease Mother   . Polycystic kidney disease Brother   . Diabetes Sister     ROS:  Please see the history of present illness.   Denies any syncope, bleeding, chest pain, shortness of breath   All other systems reviewed and negative.   PHYSICAL EXAM: VS:  BP 118/64 mmHg  Pulse 85  Ht 5\' 8"  (1.727 m)  Wt 206 lb (93.441 kg)  BMI 31.33 kg/m2 Well nourished, well developed, in no acute distress HEENT: normal, [place]/AT, EOMI Neck: no JVD, normal carotid upstroke, no bruit Cardiac:  normal S1, S2; RRR; no murmur Lungs:  clear to auscultation bilaterally, no wheezing, rhonchi or rales Abd: soft, nontender, no hepatomegaly, no bruits Ext: no edema, 2+ distal pulses Skin: warm and dry GU: deferred Neuro: no focal abnormalities noted, AAO x 3  EKG:  Today 04/21/15-sinus rhythm, 85, no other abnormalities personally viewed-previously 03/28/14-sinus rhythm, 68 with no other abnormalities.    CT chest: 03/11/15 IMPRESSION: 1. Stable to minimally increased diameter of previously demonstrated ascending thoracic aortic aneurysm now measuring 4.5 cm. Recommend followup by abdomen and pelvis CTA in 6 months, and vascular surgery referral/consultation if not already obtained.  This recommendation follows ACR consensus guidelines: White Paper of the ACR Incidental Findings Committee II on Vascular Findings. J Am Coll Radiol 2013; 10:789-794. 2. Three vessel coronary artery disease. 3. Multiple renal cysts consistent with polycystic kidney disease.  NUC stress 12:24/16:  Low risk stress nuclear study with a small, moderate intensity, partially reversible apical defect consistent with apical thinning; cannot R/O very mild apical ischemia.   LV Ejection Fraction: 49%. LV Wall Motion: Mild global hypokinesis. Overall low risk study. Continue medical mgt.  ASSESSMENT AND PLAN:  1. Dilated aortic root-increased slightly to 44 mm from 42 mm the year previously. Dr. Laneta Simmers has been seeing. He has repeat CT scan in one year. He will see me back in January 2017. Avoid power lifting. 2. Coronary artery calcification-triple-vessel coronary artery calcification seen on CT scan November 2016. Continue with statin, beta blocker. Repeat in one year.  3. Essential hypertension-well controlled. Medications reviewed. 4. Hyperlipidemia-continue with simvastatin. 5. One year follow up.   Signed, Donato Schultz, MD [facility]  04/21/2015 8:24 AM

## 2016-02-03 ENCOUNTER — Other Ambulatory Visit: Payer: Self-pay | Admitting: *Deleted

## 2016-02-03 DIAGNOSIS — I7121 Aneurysm of the ascending aorta, without rupture: Secondary | ICD-10-CM

## 2016-02-03 DIAGNOSIS — I712 Thoracic aortic aneurysm, without rupture: Secondary | ICD-10-CM

## 2016-03-02 ENCOUNTER — Other Ambulatory Visit: Payer: 59

## 2016-03-02 ENCOUNTER — Ambulatory Visit (INDEPENDENT_AMBULATORY_CARE_PROVIDER_SITE_OTHER): Payer: Managed Care, Other (non HMO) | Admitting: Surgery

## 2016-03-02 ENCOUNTER — Ambulatory Visit
Admission: RE | Admit: 2016-03-02 | Discharge: 2016-03-02 | Disposition: A | Discharge status: Home / Self Care | Payer: Managed Care, Other (non HMO) | Source: Ambulatory Visit | Attending: Surgery | Admitting: Surgery

## 2016-03-02 VITALS — BP 129/80 | HR 82 | Resp 20 | Ht 68.0 in | Wt 200.0 lb

## 2016-03-02 DIAGNOSIS — I712 Thoracic aortic aneurysm, without rupture, unspecified: Secondary | ICD-10-CM

## 2016-03-02 DIAGNOSIS — I7121 Aneurysm of the ascending aorta, without rupture: Secondary | ICD-10-CM

## 2016-03-03 ENCOUNTER — Encounter: Payer: Self-pay | Admitting: Surgery

## 2016-03-03 NOTE — Progress Notes (Signed)
HPI:  The patient returns today for follow up of an ascending aortic aneurysm. When I saw him one year ago it measured 4.5 cm and had increased in size from 4.4 cm the year before that.  His initial CT scan was done in 02/2002 and the aorta was 4.2 cm at that time. Since last year he has continued to do well and denies any chest or back pain.  Current Outpatient Prescriptions  Medication Sig Dispense Refill  . atenolol (TENORMIN) 50 MG tablet Take 50 mg by mouth daily.     . Dulaglutide (TRULICITY) 1.5 MG/0.5ML SOPN Inject into the skin once a week.    . fenofibrate 160 MG tablet Take 160 mg by mouth daily.    . simvastatin (ZOCOR) 40 MG tablet Take 40 mg by mouth daily.    Marland Kitchen. triamcinolone cream (KENALOG) 0.1 % 1 application to affected area    . telmisartan (MICARDIS) 40 MG tablet Take 1/2 tablet once a day     No current facility-administered medications for this visit.      Physical Exam: BP 129/80 (BP Location: Right Arm, Patient Position: Sitting, Cuff Size: Normal)   Pulse 82   Resp 20   Ht 5\' 8"  (1.727 m)   Wt 200 lb (90.7 kg)   SpO2 97% Comment: RA  BMI 30.41 kg/m  He looks well Cardiac exam shows a regular rate and rhythm with normal heart sounds Lungs are clear  Diagnostic Tests:  CLINICAL DATA:  Follow-up of thoracic aortic aneurysm.  EXAM: CT CHEST WITHOUT CONTRAST  TECHNIQUE: Multidetector CT imaging of the chest was performed following the standard protocol without IV contrast.  COMPARISON:  Chest CT 03/11/2015  FINDINGS: Cardiovascular: At the level of the right pulmonary artery, the ascending thoracic aorta measures 4.5 cm, unchanged from the prior study. There is no evidence of intramural hematoma. There is atherosclerotic calcification of the aorta and coronary arteries. No pericardial effusion. Heart size within normal limits.  Mediastinum/Nodes: No mediastinal, hilar or axillary lymphadenopathy. The visualized thyroid and thoracic  esophageal course are unremarkable.  Lungs/Pleura: Lungs are clear. No pleural effusion or pneumothorax.  Upper Abdomen: Kidneys are diffusely enlarged with innumerable renal cysts, some of which demonstrate hyperdensity and areas of calcification. The largest hyperdense lesion, located near the midportion of the right kidney is unchanged in size measuring 1.5 cm. Otherwise, the noncontrast appearance of the visualized portions of the upper abdominal organs is normal.  Musculoskeletal: There is no bony spinal canal stenosis. No lytic or blastic lesions.  IMPRESSION: 1. Unchanged appearance of descending thoracic aortic aneurysm. 2. Aortic and coronary artery atherosclerosis. 3. Diffusely enlarged kidneys with innumerable renal cysts of variable attenuation. Evaluation limited by field of view and lack of IV contrast.   Electronically Signed   By: Deatra RobinsonKevin  Herman M.D.   On: 03/02/2016 16:23   Impression:  I have personally reviewed and interpreted his CT scan of the chest done today. The ascending aortic aneurysm is stable at 4.5 cm. There was been no significant change over the last year. I reviewed the CT scan with the patient and answered his questions. His BP is under good control on Tenormin and Micardis.  Plan:  I will see him in one year with a CT of the chest. He will have it without contrast since he has polycystic kidney disease with an elevated creatinine.    Alleen BorneBryan K Tritia Endo, MD [facility] [phone]

## 2016-04-29 ENCOUNTER — Ambulatory Visit (INDEPENDENT_AMBULATORY_CARE_PROVIDER_SITE_OTHER): Payer: Managed Care, Other (non HMO) | Admitting: Cardiology

## 2016-04-29 ENCOUNTER — Encounter: Payer: Self-pay | Admitting: Cardiology

## 2016-04-29 VITALS — BP 122/80 | HR 81 | Ht 69.0 in | Wt 205.2 lb

## 2016-04-29 DIAGNOSIS — E7800 Pure hypercholesterolemia, unspecified: Secondary | ICD-10-CM | POA: Diagnosis not present

## 2016-04-29 DIAGNOSIS — I700 Atherosclerosis of aorta: Secondary | ICD-10-CM

## 2016-04-29 DIAGNOSIS — I10 Essential (primary) hypertension: Secondary | ICD-10-CM

## 2016-04-29 DIAGNOSIS — I77810 Thoracic aortic ectasia: Secondary | ICD-10-CM | POA: Diagnosis not present

## 2016-04-29 NOTE — Patient Instructions (Signed)

## 2016-04-29 NOTE — Progress Notes (Signed)
[address] [place], [place]  [address] Phone: [phone](336) [phone] Fax:  [phone]) [phone]  Date:  04/29/2016   ID:  Carlos Joyce, DOB Jan 15, 1961, MRN [id number]  PCP:  Lupita RaiderSHAW,KIMBERLEE, MD   History of Present Illness: Carlos RoundDave M Myint is a 56 y.o. male with ascending aortic aneurysm, 4.5 cm 11/16 and 11/17- 4.4 cm seen by Dr. Laneta SimmersBartle in November 2015, 4.2 cm 2005. Close follow-up with CT scan in one year. Blood pressure under good control with atenolol and Micardis.   Dilated aortic root stable. Overall doing very well. No symptoms. No chest pain, no shortness breath, no syncope, no bleeding. Has polycystic kidney disease. Following with Dr. Briant CedarMattingly.Off Micardis. Poly kid dz  CT scan also reveals three-vessel coronary artery calcification.    Wt Readings from Last 3 Encounters:  04/29/16 205 lb 3.2 oz (93.1 kg)  03/02/16 200 lb (90.7 kg)  04/21/15 206 lb (93.4 kg)     Past Medical History:  Diagnosis Date  . Aortic atherosclerosis (HCC) 03/07/2013  . Ascending aortic aneurysm (HCC)   . CKD (chronic kidney disease), stage III   . Diabetes mellitus (HCC)   . Essential hypertension 03/07/2013  . HBP (high blood pressure)   . Hyperlipemia 03/07/2013  . Hyperlipidemia   . Inguinal hernia recurrent bilateral   . PKD (polycystic kidney disease) 03/22/2013   Creat 1.4 10/14  . Polycystic kidney disease   . PUD (peptic ulcer disease)   . Sleep apnea    does not use CPAP    Past Surgical History:  Procedure Laterality Date  . HEMORRHOID SURGERY    . INGUINAL HERNIA REPAIR Bilateral 12/24/2014   Procedure: LAPAROSCOPIC BILATERAL INGUINAL HERNIA WITH MESH ;  Surgeon: Abigail Miyamotoouglas Blackman, MD;  Location: [facility];  Service: General;  Laterality: Bilateral;  . UMBILICAL HERNIA REPAIR N/A 12/24/2014   Procedure: HERNIA REPAIR UMBILICAL ADULT;  Surgeon: Abigail Miyamotoouglas Blackman, MD;  Location: [facility];  Service: General;  Laterality: N/A;    Current  Outpatient Prescriptions  Medication Sig Dispense Refill  . atenolol (TENORMIN) 50 MG tablet Take 50 mg by mouth daily.     . Dulaglutide (TRULICITY) 1.5 MG/0.5ML SOPN Inject into the skin once a week.    . fenofibrate 160 MG tablet Take 160 mg by mouth daily.    . simvastatin (ZOCOR) 40 MG tablet Take 40 mg by mouth daily.    Marland Kitchen. triamcinolone cream (KENALOG) 0.1 % 1 application to affected area     No current facility-administered medications for this visit.     Allergies:   No Known Allergies  Social History:  The patient  reports that he has never smoked. He has never used smokeless tobacco. He reports that he does not drink alcohol or use drugs.   Family History  Problem Relation Age of Onset  . Polycystic kidney disease Mother   . Polycystic kidney disease Brother   . Diabetes Sister     ROS:  Please see the history of present illness.   Denies any syncope, bleeding, chest pain, shortness of breath   All other systems reviewed and negative.   PHYSICAL EXAM: VS:  BP 122/80   Pulse 81   Ht 5\' 9"  (1.753 m)   Wt 205 lb 3.2 oz (93.1 kg)   BMI 30.30 kg/m  Well nourished, well developed, in no acute distress  HEENT: normal, [place]/AT, EOMI Neck: no JVD, normal carotid upstroke, no bruit Cardiac:  normal S1, S2; RRR; no murmur  Lungs:  clear to auscultation bilaterally, no wheezing, rhonchi or rales  Abd: soft, nontender, no hepatomegaly, no bruits  Ext: no edema, 2+ distal pulses Skin: warm and dry  GU: deferred Neuro: no focal abnormalities noted, AAO x 3  EKG:  Today 04/29/16-sinus rhythm 81 nonspecific ST-T wave changes personally viewed-prior 04/21/15-sinus rhythm, 85, no other abnormalities personally viewed-previously 03/28/14-sinus rhythm, 68 with no other abnormalities.    CT chest: 03/11/15 IMPRESSION: 1. Stable to minimally increased diameter of previously demonstrated ascending thoracic aortic aneurysm now measuring 4.5 cm. Recommend followup by abdomen and pelvis CTA  in 6 months, and vascular surgery referral/consultation if not already obtained. This recommendation follows ACR consensus guidelines: White Paper of the ACR Incidental Findings Committee II on Vascular Findings. J Am Coll Radiol 2013; 10:789-794. 2. Three vessel coronary artery disease. 3. Multiple renal cysts consistent with polycystic kidney disease.  CT chest 02/2016: IMPRESSION: 1. Unchanged appearance of descending thoracic aortic aneurysm. 2. Aortic and coronary artery atherosclerosis. 3. Diffusely enlarged kidneys with innumerable renal cysts of variable attenuation. Evaluation limited by field of view and lack of IV contrast. NUC stress 12:24/16:  Low risk stress nuclear study with a small, moderate intensity, partially reversible apical defect consistent with apical thinning; cannot R/O very mild apical ischemia.   LV Ejection Fraction: 49%. LV Wall Motion: Mild global hypokinesis. Overall low risk study. Continue medical mgt.  ASSESSMENT AND PLAN:  1. Dilated aortic root-increased slightly to 44 mm from 42 mm the year previously. Dr. Laneta Simmers has been seeing. He has repeat CT scan in one year. He will see me back in January 2017. Avoid power lifting. Atenolol-reducing shear stress. 2. Coronary artery calcification-triple-vessel coronary artery calcification seen on CT scan November 2016. Continue with statin, beta blocker. Repeat in one year.  3. Essential hypertension-well controlled. Medications reviewed. He is now off of myocarditis likely because of creatinine with polycystic kidney disease. 4. Hyperlipidemia-continue with simvastatin. 5. One year follow up.   Signed, Donato Schultz, MD [facility]  04/29/2016 3:58 PM

## 2016-08-26 ENCOUNTER — Emergency Department (HOSPITAL_COMMUNITY): Payer: Managed Care, Other (non HMO)

## 2016-08-26 ENCOUNTER — Encounter (HOSPITAL_COMMUNITY): Payer: Self-pay | Admitting: Emergency Medicine

## 2016-08-26 ENCOUNTER — Emergency Department (HOSPITAL_COMMUNITY)
Admission: EM | Admit: 2016-08-26 | Discharge: 2016-08-26 | Disposition: A | Discharge status: Home / Self Care | Payer: Managed Care, Other (non HMO) | Attending: Emergency Medicine | Admitting: Emergency Medicine

## 2016-08-26 DIAGNOSIS — W11XXXA Fall on and from ladder, initial encounter: Secondary | ICD-10-CM | POA: Diagnosis not present

## 2016-08-26 DIAGNOSIS — Y999 Unspecified external cause status: Secondary | ICD-10-CM | POA: Diagnosis not present

## 2016-08-26 DIAGNOSIS — I129 Hypertensive chronic kidney disease with stage 1 through stage 4 chronic kidney disease, or unspecified chronic kidney disease: Secondary | ICD-10-CM | POA: Diagnosis not present

## 2016-08-26 DIAGNOSIS — Y939 Activity, unspecified: Secondary | ICD-10-CM | POA: Diagnosis not present

## 2016-08-26 DIAGNOSIS — S92354A Nondisplaced fracture of fifth metatarsal bone, right foot, initial encounter for closed fracture: Secondary | ICD-10-CM | POA: Insufficient documentation

## 2016-08-26 DIAGNOSIS — Z79899 Other long term (current) drug therapy: Secondary | ICD-10-CM | POA: Insufficient documentation

## 2016-08-26 DIAGNOSIS — Y929 Unspecified place or not applicable: Secondary | ICD-10-CM | POA: Insufficient documentation

## 2016-08-26 DIAGNOSIS — S99921A Unspecified injury of right foot, initial encounter: Secondary | ICD-10-CM | POA: Diagnosis present

## 2016-08-26 DIAGNOSIS — N183 Chronic kidney disease, stage 3 (moderate): Secondary | ICD-10-CM | POA: Insufficient documentation

## 2016-08-26 MED ORDER — HYDROCODONE-ACETAMINOPHEN 5-325 MG PO TABS: 1.0000 | ORAL_TABLET | Freq: Four times a day (QID) | ORAL | 0 refills | Status: DC | PRN

## 2016-08-26 NOTE — ED Provider Notes (Signed)
[facility]-EMERGENCY DEPT Provider Note   CSN: [id number] Arrival date & time: 08/26/16  1903  By signing my name below, I, Carlos Joyce, attest that this documentation has been prepared under the direction and in the presence of  [facility], PA-C. Electronically Signed: Doreatha Joyce, ED Scribe. 08/26/16. 8:23 PM.    History   Chief Complaint Chief Complaint  Patient presents with  . Foot Injury    HPI Carlos Joyce is a 56 y.o. male who presents to the Emergency Department complaining of moderate, constant lateral right foot pain and swelling s/p injury that occurred earlier today. Pt states he was on a ladder approximately 3 feet off the ground when it tipped and he tried to get off, landing with all his weight on his right foot flat on the ground. He denies head injury or LOC. Per pt, his lateral right foot was mildly painful throughout the week prior to his injury and he is not sure what caused his initial pain. He states his pain is worsened with touch, weight bearing and ambulation. Per pt, he was able to bear weight normally on the foot prior to the injury this evening. Now does not feel like he can ambulate. No alleviating factors noted and no medications tried PTA. He denies numbness, ankle pain, additional injuries.    The history is provided by the patient. No language interpreter was used.    Past Medical History:  Diagnosis Date  . Aortic atherosclerosis (HCC) 03/07/2013  . Ascending aortic aneurysm (HCC)   . CKD (chronic kidney disease), stage III   . Diabetes mellitus (HCC)   . Essential hypertension 03/07/2013  . HBP (high blood pressure)   . Hyperlipemia 03/07/2013  . Hyperlipidemia   . Inguinal hernia recurrent bilateral   . PKD (polycystic kidney disease) 03/22/2013   Creat 1.4 10/14  . Polycystic kidney disease   . PUD (peptic ulcer disease)   . Sleep apnea    does not use CPAP    Patient Active Problem List   Diagnosis Date Noted  . Dilated aortic root  (HCC) 03/28/2014  . PKD (polycystic kidney disease) 03/22/2013  . Hyperlipidemia 03/22/2013  . Diabetes (HCC) 03/22/2013  . Aortic atherosclerosis (HCC) 03/07/2013  . Essential hypertension 03/07/2013  . Hyperlipemia 03/07/2013  . Ascending aortic aneurysm [facility])     Past Surgical History:  Procedure Laterality Date  . HEMORRHOID SURGERY    . INGUINAL HERNIA REPAIR Bilateral 12/24/2014   Procedure: LAPAROSCOPIC BILATERAL INGUINAL HERNIA WITH MESH ;  Surgeon: Abigail Miyamoto, MD;  Location: [facility];  Service: General;  Laterality: Bilateral;  . UMBILICAL HERNIA REPAIR N/A 12/24/2014   Procedure: HERNIA REPAIR UMBILICAL ADULT;  Surgeon: Abigail Miyamoto, MD;  Location: [facility];  Service: General;  Laterality: N/A;       Home Medications    Prior to Admission medications   Medication Sig Start Date End Date Taking? Authorizing Provider  atenolol (TENORMIN) 50 MG tablet Take 50 mg by mouth daily.     [provider]  Dulaglutide (TRULICITY) 1.5 MG/0.5ML SOPN Inject into the skin once a week.    [provider]  fenofibrate 160 MG tablet Take 160 mg by mouth daily.    [provider]  HYDROcodone-acetaminophen (NORCO/VICODIN) 5-325 MG tablet Take 1 tablet by mouth every 6 (six) hours as needed for severe pain. 08/26/16   Keena Dinse, Chase Picket, PA-C  simvastatin (ZOCOR) 40 MG tablet Take 40 mg by mouth daily.  [provider]  triamcinolone cream (KENALOG) 0.1 % 1 application to affected area 10/03/14   [provider]    Family History Family History  Problem Relation Age of Onset  . Polycystic kidney disease Mother   . Polycystic kidney disease Brother   . Diabetes Sister     Social History Social History  Substance Use Topics  . Smoking status: Never Smoker  . Smokeless tobacco: Never Used  . Alcohol use No     Allergies   Patient has no known allergies.   Review of Systems Review of  Systems  Musculoskeletal: Positive for arthralgias (right foot) and joint swelling (right foot).  Neurological: Negative for syncope and numbness.     Physical Exam Updated Vital Signs BP (!) 133/93 (BP Location: Right Arm)   Pulse 90   Temp 98.9 F (37.2 C) (Oral)   Resp 14   Ht 5\' 9"  (1.753 m)   Wt 89.5 kg   SpO2 98%   BMI 29.14 kg/m   Physical Exam  Constitutional: He appears well-developed and well-nourished. No distress.  HENT:  Head: Normocephalic and atraumatic.  Neck: Neck supple.  Cardiovascular: Normal rate, regular rhythm and normal heart sounds.   No murmur heard. DP pulses intact.   Pulmonary/Chest: Effort normal and breath sounds normal. No respiratory distress. He has no wheezes. He has no rales.  Musculoskeletal: Normal range of motion. He exhibits edema and tenderness.  Tenderness over the 5th metatarsal with associated swelling. Unable to ambulate 2/2 pain. 2+ DP. Sensation intact.  Neurological: He is alert.  Skin: Skin is warm and dry.  Nursing note and vitals reviewed.   ED Treatments / Results   DIAGNOSTIC STUDIES: Oxygen Saturation is 98% on RA, normal by my interpretation.    COORDINATION OF CARE: 8:20 PM Discussed treatment plan with pt at bedside which includes XR and pt agreed to plan.   Radiology Dg Foot Complete Right  Result Date: 08/26/2016 CLINICAL DATA:  Lateral right foot pain after fall from step ladder today. EXAM: RIGHT FOOT COMPLETE - 3+ VIEW COMPARISON:  None. FINDINGS: Transverse nondisplaced fracture base of the fifth metatarsal. No intra-articular extension. No additional acute fracture of the foot. Os peroneal incidentally noted. There is a prominent plantar calcaneal spur. IMPRESSION: Transverse nondisplaced fracture base of the fifth metatarsal. No intra-articular extension. Electronically Signed   By: Rubye Oaks M.D.   On: 08/26/2016 20:29    Procedures Procedures (including critical care time)  SPLINT  APPLICATION Authorized by: Elizabeth Sauer, PA-C Consent: Verbal consent obtained. Risks and benefits: risks, benefits and alternatives were discussed Consent given by: patient Splint applied by: orthopedic technician Location details: right foot Splint type: short leg splint  Post-procedure: The splinted body part was neurovascularly unchanged following the procedure. Patient tolerance: Patient tolerated the procedure well with no immediate complications.     Medications Ordered in ED Medications - No data to display   Initial Impression / Assessment and Plan / ED Course  I have reviewed the triage vital signs and the nursing notes.  Pertinent imaging results that were available during my care of the patient were reviewed by me and considered in my medical decision making (see chart for details).    KELLAN RAFFIELD is a 56 y.o. male who presents to ED for right lateral foot pain after stepping off 3 foot ladder with all pressure on right foot just prior to arrival. On exam, patient with tenderness to palpation of 5th MT and unable  to bear weight due to pain. RLE is NVI. X-ray shows non-displaced transverse fx to the base of the 5th metatarsal. Patient placed in short leg splint and understands he is to be non-weight bearing until he follows up with ortho. Ortho referral information given and he agrees to call first thing Monday am. Home care instructions and return precautions discussed. All questions answered.  Patient discussed with Dr. Rush Landmarkegeler who agrees with treatment plan.    Final Clinical Impressions(s) / ED Diagnoses   Final diagnoses:  Closed nondisplaced fracture of fifth metatarsal bone of right foot, initial encounter    New Prescriptions Discharge Medication List as of 08/26/2016 10:00 PM    START taking these medications   Details  HYDROcodone-acetaminophen (NORCO/VICODIN) 5-325 MG tablet Take 1 tablet by mouth every 6 (six) hours as needed for severe pain., Starting Fri  08/26/2016, Print        I personally performed the services described in this documentation, which was scribed in my presence. The recorded information has been reviewed and is accurate.    Jermani Eberlein, Chase PicketJaime Pilcher, PA-C 08/27/16 0308    Tegeler, Canary Brimhristopher J, MD 08/27/16 1318

## 2016-08-26 NOTE — ED Triage Notes (Signed)
Pt reports jumping off ladder aprox 3 foot high, landing on right foot. C/o outer right foot pain.

## 2016-08-26 NOTE — Discharge Instructions (Signed)
Do not put any weight on your foot until you see the orthopedist. Please call the orthopedist listed first thing Monday morning to schedule your follow-up appointment. Ibuprofen or Tylenol as needed for pain. Pain medication is reserved strictly for severe pain- This can make you very drowsy - please do not drink alcohol, operate heavy machinery or drive on this medication.   Elevate the leg to help improve swelling.  Please return to the emergency department for acute worsening of pain, inability to wiggle your toes, numb sensation, new or worsening symptoms, any additional concerns.

## 2016-08-30 ENCOUNTER — Ambulatory Visit (INDEPENDENT_AMBULATORY_CARE_PROVIDER_SITE_OTHER): Payer: Managed Care, Other (non HMO) | Admitting: Physician Assistant

## 2016-08-30 DIAGNOSIS — S92354A Nondisplaced fracture of fifth metatarsal bone, right foot, initial encounter for closed fracture: Secondary | ICD-10-CM | POA: Diagnosis not present

## 2016-08-30 NOTE — Progress Notes (Signed)
Office Visit Note   Patient: Martie RoundDave M Kimbell           Date of Birth: 05-03-1960           MRN: [id number] Visit Date: 08/30/2016              Requested by: Lupita RaiderShaw, Kimberlee, MD [address] [place], [place] [address] PCP: Lupita RaiderShaw, Kimberlee, MD   Assessment & Plan: Visit Diagnoses:  1. Nondisplaced fracture of fifth metatarsal bone, right foot, initial encounter for closed fracture     Plan: Spoke with him about the need for smoking cessation and how I can directly affect healing. Elevation wiggling toes encouraged. He is heel weightbearing in a Personal assistantCam Walker boot. Did ask him to take his 81 mg twice a day instead of once a day due to the fact he will not be bearing full weight on the leg. We'll see him back in 2 weeks obtain AP lateral and oblique views of the right foot. Prescription was written for a knee scooter. Radiographs right foot AP lateral oblique dated 08/26/2016 shows the nondisplaced transverse fifth metatarsal fracture proximal portion of zone 3. This appears to be probable stress fracture on an acute fracture due to the sclerotic activity. No other fractures of foot seen.  Follow-Up Instructions: No Follow-up on file.   Orders:  No orders of the defined types were placed in this encounter.  No orders of the defined types were placed in this encounter.     Procedures: No procedures performed   Clinical Data: No additional findings.   Subjective: Fifth metatarsal fracture  HPI Mr. Chilton SiGreen is a 56 year old male who was installing a conditioner units and was on a ladder fell about 3 feet landing on his feet. At increased pain in his right foot after the fall. Injury occurred on 08/26/2016. Seen in the ER that day radiographs were obtained and showed he has a nondisplaced fifth metatarsal fracture. He does note he had some pain in the foot prior to the fall. He had no other injuries. He is diabetic been diabetic for approximately 10 years. Smokes about 2  cigarettes a day. Review of Systems   Objective: Vital Signs: There were no vitals taken for this visit.  Physical Exam  Constitutional: He is oriented to person, place, and time. He appears well-developed and well-nourished. No distress.  Pulmonary/Chest: Effort normal.  Neurological: He is alert and oriented to person, place, and time.  Psychiatric: He has a normal mood and affect.    Ortho Exam Right foot he has tenderness over the base of the fifth metatarsal. Significant edema of the right foot compared to left. Slightly palpable dorsal pedal pulse left foot unable to palpate on the right. Onychomycosis of the toenails of the right foot. Otherwise no rashes skin lesions ulcerations erythema of the right foot. Remainder of the right foot is nontender.  Specialty Comments:  No specialty comments available.  Imaging: No results found.   PMFS History: Patient Active Problem List   Diagnosis Date Noted  . Dilated aortic root (HCC) 03/28/2014  . PKD (polycystic kidney disease) 03/22/2013  . Hyperlipidemia 03/22/2013  . Diabetes (HCC) 03/22/2013  . Aortic atherosclerosis (HCC) 03/07/2013  . Essential hypertension 03/07/2013  . Hyperlipemia 03/07/2013  . Ascending aortic aneurysm [facility](HCC)    Past Medical History:  Diagnosis Date  . Aortic atherosclerosis (HCC) 03/07/2013  . Ascending aortic aneurysm (HCC)   . CKD (chronic kidney disease), stage III   .  Diabetes mellitus (HCC)   . Essential hypertension 03/07/2013  . HBP (high blood pressure)   . Hyperlipemia 03/07/2013  . Hyperlipidemia   . Inguinal hernia recurrent bilateral   . PKD (polycystic kidney disease) 03/22/2013   Creat 1.4 10/14  . Polycystic kidney disease   . PUD (peptic ulcer disease)   . Sleep apnea    does not use CPAP    Family History  Problem Relation Age of Onset  . Polycystic kidney disease Mother   . Polycystic kidney disease Brother   . Diabetes Sister     Past Surgical History:  Procedure  Laterality Date  . HEMORRHOID SURGERY    . INGUINAL HERNIA REPAIR Bilateral 12/24/2014   Procedure: LAPAROSCOPIC BILATERAL INGUINAL HERNIA WITH MESH ;  Surgeon: Abigail Miyamoto, MD;  Location: [facility];  Service: General;  Laterality: Bilateral;  . UMBILICAL HERNIA REPAIR N/A 12/24/2014   Procedure: HERNIA REPAIR UMBILICAL ADULT;  Surgeon: Abigail Miyamoto, MD;  Location: [facility];  Service: General;  Laterality: N/A;   Social History   Occupational History  . furniture maker [employer]   Social History Main Topics  . Smoking status: Never Smoker  . Smokeless tobacco: Never Used  . Alcohol use No  . Drug use: No  . Sexual activity: Yes

## 2016-09-13 ENCOUNTER — Ambulatory Visit (INDEPENDENT_AMBULATORY_CARE_PROVIDER_SITE_OTHER): Payer: Managed Care, Other (non HMO)

## 2016-09-13 ENCOUNTER — Ambulatory Visit (INDEPENDENT_AMBULATORY_CARE_PROVIDER_SITE_OTHER): Payer: Managed Care, Other (non HMO) | Admitting: Orthopaedic Surgery

## 2016-09-13 DIAGNOSIS — S62346A Nondisplaced fracture of base of fifth metacarpal bone, right hand, initial encounter for closed fracture: Secondary | ICD-10-CM | POA: Insufficient documentation

## 2016-09-13 DIAGNOSIS — S62346D Nondisplaced fracture of base of fifth metacarpal bone, right hand, subsequent encounter for fracture with routine healing: Secondary | ICD-10-CM

## 2016-09-13 DIAGNOSIS — M25571 Pain in right ankle and joints of right foot: Secondary | ICD-10-CM

## 2016-09-13 NOTE — Progress Notes (Signed)
  The patient is now just over 2 weeks into a right fifth metatarsal base fracture. This is more of a true Jones fracture. He's been in a walking boot. He is doing well. He is a smoker. He is also listed as a diabetic. He says his pain is manageable and is been weightbearing as tolerated in the Cam Walker  On examination direct palpation over the fifth metatarsal causes minimal discomfort. He moves his toes and foot and ankle easily otherwise.  New x-rays of the right foot are obtained and you can see the right fifth metatarsal base fracture. This is more the metaphyseal diaphyseal junction. There is slight interval healing and no displacement.  At this point we'll transition him to a postop shoe. He'll continue weightbearing as tolerated. I've counseled him about smoking and good blood glucose control. We'll see him back in a month with repeat 3 views of his right foot.

## 2016-10-13 ENCOUNTER — Ambulatory Visit (INDEPENDENT_AMBULATORY_CARE_PROVIDER_SITE_OTHER): Payer: Managed Care, Other (non HMO)

## 2016-10-13 ENCOUNTER — Ambulatory Visit (INDEPENDENT_AMBULATORY_CARE_PROVIDER_SITE_OTHER): Payer: Self-pay | Admitting: Orthopaedic Surgery

## 2016-10-13 DIAGNOSIS — S62346D Nondisplaced fracture of base of fifth metacarpal bone, right hand, subsequent encounter for fracture with routine healing: Secondary | ICD-10-CM

## 2016-10-13 NOTE — Progress Notes (Signed)
Patient is very pleasant that spiraled gentleman well known to me. He is a diabetic and is a smoker but decreased recently quit. He sustained a fifth fifth metatarsal fracture of his right foot in early May. He is about 7 weeks since his fracture and he says he has minimal tenderness or pain.  On examination of his right foot he still has some tenderness to palpation and stress of the fifth metatarsal areas. 3 views of the foot are obtained on the right side it does show his fracture is present but it is showing signs of healing.  At this point I talked about slowly increasing activities but still avoiding high-impact activities until he is pain-free. I told him we can always see him back in the office in about 4-6 weeks for repeat x-rays but he said he feels like he is doing well and can follow up as needed. He says if he has issues he'll let us know.

## 2017-02-10 ENCOUNTER — Other Ambulatory Visit: Payer: Self-pay | Admitting: Surgery

## 2017-02-10 DIAGNOSIS — I712 Thoracic aortic aneurysm, without rupture, unspecified: Secondary | ICD-10-CM

## 2017-03-01 ENCOUNTER — Ambulatory Visit: Payer: Managed Care, Other (non HMO) | Admitting: Surgery

## 2017-03-01 ENCOUNTER — Other Ambulatory Visit: Payer: Managed Care, Other (non HMO)

## 2017-05-03 ENCOUNTER — Ambulatory Visit: Payer: Managed Care, Other (non HMO) | Admitting: Surgery

## 2017-05-03 ENCOUNTER — Other Ambulatory Visit: Payer: Managed Care, Other (non HMO)

## 2017-05-17 ENCOUNTER — Ambulatory Visit: Payer: Managed Care, Other (non HMO) | Admitting: Surgery

## 2017-05-31 ENCOUNTER — Ambulatory Visit: Payer: Managed Care, Other (non HMO) | Admitting: Surgery

## 2017-05-31 ENCOUNTER — Other Ambulatory Visit: Payer: Managed Care, Other (non HMO)

## 2017-06-26 ENCOUNTER — Other Ambulatory Visit: Payer: Managed Care, Other (non HMO)

## 2017-07-05 ENCOUNTER — Ambulatory Visit: Payer: Managed Care, Other (non HMO) | Admitting: Surgery

## 2018-01-19 ENCOUNTER — Other Ambulatory Visit: Payer: Self-pay | Admitting: Surgery

## 2018-01-19 DIAGNOSIS — I712 Thoracic aortic aneurysm, without rupture, unspecified: Secondary | ICD-10-CM

## 2018-02-12 ENCOUNTER — Encounter: Payer: Self-pay | Admitting: Cardiology

## 2018-02-21 ENCOUNTER — Encounter: Payer: Self-pay | Admitting: Surgery

## 2018-02-21 ENCOUNTER — Other Ambulatory Visit: Payer: Self-pay

## 2018-02-21 ENCOUNTER — Ambulatory Visit
Admission: RE | Admit: 2018-02-21 | Discharge: 2018-02-21 | Disposition: A | Discharge status: Home / Self Care | Payer: 59 | Source: Ambulatory Visit | Attending: Surgery | Admitting: Surgery

## 2018-02-21 ENCOUNTER — Ambulatory Visit (INDEPENDENT_AMBULATORY_CARE_PROVIDER_SITE_OTHER): Payer: 59 | Admitting: Surgery

## 2018-02-21 VITALS — BP 123/82 | HR 81 | Resp 16 | Ht 69.0 in | Wt 205.0 lb

## 2018-02-21 DIAGNOSIS — I712 Thoracic aortic aneurysm, without rupture, unspecified: Secondary | ICD-10-CM

## 2018-02-21 DIAGNOSIS — I7121 Aneurysm of the ascending aorta, without rupture: Secondary | ICD-10-CM

## 2018-02-21 NOTE — Progress Notes (Signed)
HPI:  The patient returns today for follow-up of an ascending aortic aneurysm.  I last saw him on 03/02/2016 and the diameter of the ascending aorta was 4.5 cm which was unchanged from the prior study of 03/11/2015.  He did not have any CT contrast due to his history of polycystic kidney disease with elevated creatinine.  I was planning to see him back in 1 year with a follow-up CT but he has not returned for 2 years because his wife was ill with metastatic breast cancer.  She subsequently died in 06-26-17.  He denies any chest pain or shortness of breath.  Current Outpatient Medications  Medication Sig Dispense Refill  . atenolol (TENORMIN) 50 MG tablet Take 50 mg by mouth daily.     . Dulaglutide (TRULICITY) 1.5 MG/0.5ML SOPN Inject into the skin once a week.    . fenofibrate 160 MG tablet Take 160 mg by mouth daily.    Marland Kitchen HYDROcodone-acetaminophen (NORCO/VICODIN) 5-325 MG tablet Take 1 tablet by mouth every 6 (six) hours as needed for severe pain. 4 tablet 0  . simvastatin (ZOCOR) 40 MG tablet Take 40 mg by mouth daily.    Marland Kitchen triamcinolone cream (KENALOG) 0.1 % 1 application to affected area     No current facility-administered medications for this visit.      Physical Exam: BP 123/82 (BP Location: Left Arm, Patient Position: Sitting, Cuff Size: Large)   Pulse 81   Resp 16   Ht 5\' 9"  (1.753 m)   Wt 205 lb (93 kg)   SpO2 96% Comment: ON RA  BMI 30.27 kg/m  He looks well. Cardiac exam shows a regular rate and rhythm with normal heart sounds.  There is no murmur. Lungs are clear.  Diagnostic Tests:  CLINICAL DATA:  57 year old male with history of aortic aneurysm.  EXAM: CT CHEST WITHOUT CONTRAST  TECHNIQUE: Multidetector CT imaging of the chest was performed following the standard protocol without IV contrast.  COMPARISON:  Chest CT 03/02/2016.  FINDINGS: Cardiovascular: Heart size is normal. There is no significant pericardial fluid, thickening or  pericardial calcification. There is aortic atherosclerosis, as well as atherosclerosis of the great vessels of the mediastinum and the coronary arteries, including calcified atherosclerotic plaque in the left main, left anterior descending, left circumflex and right coronary arteries. Mild aneurysmal dilatation of the ascending thoracic aorta (4.5 cm in diameter) is unchanged.  Mediastinum/Nodes: No pathologically enlarged mediastinal or hilar lymph nodes. Please note that accurate exclusion of hilar adenopathy is limited on noncontrast CT scans. Esophagus is unremarkable in appearance. No axillary lymphadenopathy.  Lungs/Pleura: No suspicious appearing pulmonary nodules or masses. No acute consolidative airspace disease. No pleural effusions.  Upper Abdomen: Aortic atherosclerosis. Kidneys are incompletely visualized, but there is clear evidence of polycystic kidney disease.  Musculoskeletal: There are no aggressive appearing lytic or blastic lesions noted in the visualized portions of the skeleton.  IMPRESSION: 1. Mild aneurysmal dilatation of the ascending thoracic aorta (4.5 cm in diameter), unchanged compared to the prior study. Ascending thoracic aortic aneurysm. Recommend semi-annual imaging followup by CTA or MRA and referral to cardiothoracic surgery if not already obtained. This recommendation follows 2010 ACCF/AHA/AATS/ACR/ASA/SCA/SCAI/SIR/STS/SVM Guidelines for the Diagnosis and Management of Patients With Thoracic Aortic Disease. Circulation. 2010; 121: Z610-R604. 2. As well, there is left main and 3 vessel coronary artery disease. Please note that although the presence of coronary artery calcium documents the presence of coronary artery disease, the severity of this disease and any  potential stenosis cannot be assessed on this non-gated CT examination. Assessment for potential risk factor modification, dietary therapy or pharmacologic therapy may be warranted,  if clinically indicated. 3. Polycystic kidney disease.  Aortic Atherosclerosis (ICD10-I70.0).   Electronically Signed   By: Trudie Reed M.D.   On: 02/21/2018 14:45   Impression:  His ascending aortic aneurysm is stable at 4.5 cm.  I recommended that he have a follow-up CT scan in 1 year.  This will be done without contrast due to his history of polycystic kidney disease with elevated creatinine.  I discussed the importance of good blood pressure control.  His blood pressure is in the normal range today and he is on atenolol.  I advised him against doing any heavy lifting of more than 35 pounds.  Also advised him against taking quinolone antibiotics.  Plan:  I will see him back in 1 year with a CT scan of the chest without contrast.  I spent 15 minutes performing this established patient evaluation and > 50% of this time was spent face to face counseling and coordinating the care of this patient's aortic aneurysm.    Alleen Borne, MD [facility] [phone]

## 2018-03-06 ENCOUNTER — Encounter: Payer: Self-pay | Admitting: Cardiology

## 2018-03-06 ENCOUNTER — Ambulatory Visit: Payer: 59 | Admitting: Cardiology

## 2018-03-06 VITALS — BP 120/86 | HR 76 | Ht 69.0 in | Wt 215.0 lb

## 2018-03-06 DIAGNOSIS — I10 Essential (primary) hypertension: Secondary | ICD-10-CM | POA: Diagnosis not present

## 2018-03-06 DIAGNOSIS — I700 Atherosclerosis of aorta: Secondary | ICD-10-CM

## 2018-03-06 DIAGNOSIS — I77810 Thoracic aortic ectasia: Secondary | ICD-10-CM | POA: Diagnosis not present

## 2018-03-06 DIAGNOSIS — I2584 Coronary atherosclerosis due to calcified coronary lesion: Secondary | ICD-10-CM

## 2018-03-06 DIAGNOSIS — I2510 Atherosclerotic heart disease of native coronary artery without angina pectoris: Secondary | ICD-10-CM | POA: Diagnosis not present

## 2018-03-06 NOTE — Patient Instructions (Signed)
Medication Instructions:  The current medical regimen is effective;  continue present plan and medications.  If you need a refill on your cardiac medications before your next appointment, please call your pharmacy.   Follow-Up: At [facility], you and your health needs are our priority.  As part of our continuing mission to provide you with exceptional heart care, we have created designated Provider Care Teams.  These Care Teams include your primary Cardiologist (physician) and Advanced Practice Providers (APPs -  Physician Assistants and Nurse Practitioners) who all work together to provide you with the care you need, when you need it. You will need a follow up appointment in 12 months.  Please call our office 2 months in advance to schedule this appointment.  You may see Dr Skains. or one of the following Advanced Practice Providers on your designated Care Team:   Lori Gerhardt, NP Laura Ingold, NP . Jill McDaniel, NP  Thank you for choosing [facility]!!      

## 2018-03-06 NOTE — Progress Notes (Signed)
[address] [place], [place]  [address] Phone: [phone] Fax:  [phone]  Date:  03/06/2018   ID:  Carlos Joyce, DOB 11/01/1960, MRN [id number]  PCP:  Lupita RaiderShaw, Kimberlee, MD   History of Present Illness: Carlos Joyce is a 57 y.o. male here for follow-up of ascending aortic aneurysm, 4.5 cm 02/2018, 11/16 and 11/17- 4.4 cm seen by Dr. Laneta SimmersBartle in November 2015, 4.2 cm 2005. Close follow-up with CT scan in one year. Blood pressure under good control with atenolol and telmisartan.   Dilated aortic root stable.  4.5 cm.  Has polycystic kidney disease.   CT scan also reveals three-vessel coronary artery calcification.  Overall no change in symptoms.  No chest pain, no shortness of breath, no syncope, no bleeding.  Unfortunately, his wife died after a battle with breast cancer since 2012.  Still grieving through this.    Wt Readings from Last 3 Encounters:  03/06/18 215 lb (97.5 kg)  02/21/18 205 lb (93 kg)  08/26/16 197 lb 5 oz (89.5 kg)     Past Medical History:  Diagnosis Date  . Aortic atherosclerosis (HCC) 03/07/2013  . Ascending aortic aneurysm (HCC)   . CKD (chronic kidney disease), stage III (HCC)   . Diabetes mellitus (HCC)   . Essential hypertension 03/07/2013  . HBP (high blood pressure)   . Hyperlipemia 03/07/2013  . Hyperlipidemia   . Inguinal hernia recurrent bilateral   . PKD (polycystic kidney disease) 03/22/2013   Creat 1.4 10/14  . Polycystic kidney disease   . PUD (peptic ulcer disease)   . Sleep apnea    does not use CPAP    Past Surgical History:  Procedure Laterality Date  . HEMORRHOID SURGERY    . INGUINAL HERNIA REPAIR Bilateral 12/24/2014   Procedure: LAPAROSCOPIC BILATERAL INGUINAL HERNIA WITH MESH ;  Surgeon: Abigail Miyamotoouglas Blackman, MD;  Location: [facility];  Service: General;  Laterality: Bilateral;  . UMBILICAL HERNIA REPAIR N/A 12/24/2014   Procedure: HERNIA REPAIR UMBILICAL ADULT;  Surgeon: Abigail Miyamotoouglas Blackman,  MD;  Location: [facility];  Service: General;  Laterality: N/A;    Current Outpatient Medications  Medication Sig Dispense Refill  . aspirin 81 MG tablet Take 81 mg by mouth daily.    Marland Kitchen. atenolol (TENORMIN) 50 MG tablet Take 50 mg by mouth daily.     . fenofibrate 160 MG tablet Take 160 mg by mouth daily.    . Insulin Degludec-Liraglutide (XULTOPHY) 100-3.6 UNIT-MG/ML SOPN Inject 10 mLs into the skin daily.    Marland Kitchen. JYNARQUE 90 & 30 MG TBPK Patient reports taking 90mg  in the morning and 30mg  in the afternoon.    . simvastatin (ZOCOR) 40 MG tablet Take 40 mg by mouth daily.    Marland Kitchen. telmisartan (MICARDIS) 20 MG tablet Take 20 mg by mouth daily.  1   No current facility-administered medications for this visit.     Allergies:   No Known Allergies  Social History:  The patient  reports that he has never smoked. He has never used smokeless tobacco. He reports that he does not drink alcohol or use drugs.   Family History  Problem Relation Age of Onset  . Polycystic kidney disease Mother   . Polycystic kidney disease Brother   . Diabetes Sister     ROS:  Please see the history of present illness.   Denies any syncope, bleeding, chest pain, shortness of breath   All other  systems reviewed and negative.   PHYSICAL EXAM: VS:  BP 120/86   Pulse 76   Ht 5\' 9"  (1.753 m)   Wt 215 lb (97.5 kg)   BMI 31.75 kg/m  GEN: Well nourished, well developed, in no acute distress  HEENT: normal  Neck: no JVD, carotid bruits, or masses Cardiac: RRR; no murmurs, rubs, or gallops,no edema  Respiratory:  clear to auscultation bilaterally, normal work of breathing GI: soft, nontender, nondistended, + BS MS: no deformity or atrophy  Skin: warm and dry, no rash Neuro:  Alert and Oriented x 3, Strength and sensation are intact Psych: euthymic mood, full affect   EKG:  Today 03/06/2018-sinus rhythm 04/29/16-sinus rhythm 81 nonspecific ST-T wave changes personally viewed-prior 04/21/15-sinus rhythm,  85, no other abnormalities personally viewed-previously 03/28/14-sinus rhythm, 68 with no other abnormalities.    CT chest: 03/11/15 IMPRESSION: 1. Stable to minimally increased diameter of previously demonstrated ascending thoracic aortic aneurysm now measuring 4.5 cm. Recommend followup by abdomen and pelvis CTA in 6 months, and vascular surgery referral/consultation if not already obtained. This recommendation follows ACR consensus guidelines: White Paper of the ACR Incidental Findings Committee II on Vascular Findings. J Am Coll Radiol 2013; 10:789-794. 2. Three vessel coronary artery disease. 3. Multiple renal cysts consistent with polycystic kidney disease.  CT chest 02/2016: IMPRESSION: 1. Unchanged appearance of descending thoracic aortic aneurysm. 2. Aortic and coronary artery atherosclerosis. 3. Diffusely enlarged kidneys with innumerable renal cysts of variable attenuation. Evaluation limited by field of view and lack of IV contrast. NUC stress 12:24/16:  Low risk stress nuclear study with a small, moderate intensity, partially reversible apical defect consistent with apical thinning; cannot R/O very mild apical ischemia.   LV Ejection Fraction: 49%. LV Wall Motion: Mild global hypokinesis. Overall low risk study. Continue medical mgt.  ASSESSMENT AND PLAN:  1. Dilated aortic root-65mm stable Dr. Laneta Simmers has been seeing. He has repeat CT scan in one year. Avoid power lifting. Atenolol-reducing shear stress. Avoid Cipro 2. Coronary artery calcification-triple-vessel coronary artery calcification seen on CT scan November 2016. Continue with statin, beta blocker. Repeat in one year. No changes. No CP.  3. Essential hypertension-well controlled. Medications reviewed. No changes 4. Hyperlipidemia-continue with simvastatin. 5. Grieving- wife died with breast cancer earlier in 2019. 6. One year follow up.   Signed, Donato Schultz, MD [facility]  03/06/2018 5:26 PM

## 2018-04-30 ENCOUNTER — Encounter (HOSPITAL_COMMUNITY): Payer: Self-pay

## 2018-04-30 ENCOUNTER — Ambulatory Visit (HOSPITAL_COMMUNITY)
Admission: RE | Admit: 2018-04-30 | Discharge: 2018-04-30 | Disposition: A | Discharge status: Home / Self Care | Payer: 59 | Source: Ambulatory Visit | Attending: Physician Assistant | Admitting: Physician Assistant

## 2018-04-30 ENCOUNTER — Other Ambulatory Visit: Payer: Self-pay | Admitting: Physician Assistant

## 2018-04-30 ENCOUNTER — Other Ambulatory Visit (HOSPITAL_COMMUNITY): Payer: Self-pay | Admitting: Physician Assistant

## 2018-04-30 DIAGNOSIS — R109 Unspecified abdominal pain: Secondary | ICD-10-CM | POA: Insufficient documentation

## 2018-04-30 MED ORDER — IOHEXOL 300 MG/ML  SOLN
100.0000 mL | Freq: Once | INTRAMUSCULAR | Status: AC | PRN
  Administered 2018-04-30: 100 mL via INTRAVENOUS

## 2019-01-11 ENCOUNTER — Encounter: Payer: Self-pay | Admitting: Radiology

## 2019-01-25 ENCOUNTER — Other Ambulatory Visit: Payer: Self-pay | Admitting: Surgery

## 2019-01-25 DIAGNOSIS — I712 Thoracic aortic aneurysm, without rupture, unspecified: Secondary | ICD-10-CM

## 2019-02-18 ENCOUNTER — Other Ambulatory Visit: Payer: Self-pay

## 2019-02-18 DIAGNOSIS — Z20822 Contact with and (suspected) exposure to covid-19: Secondary | ICD-10-CM

## 2019-02-19 LAB — NOVEL CORONAVIRUS, NAA: SARS-CoV-2, NAA: NOT DETECTED

## 2019-02-22 ENCOUNTER — Other Ambulatory Visit: Payer: Self-pay | Admitting: Surgery

## 2019-02-27 ENCOUNTER — Ambulatory Visit: Payer: 59 | Admitting: Surgery

## 2019-02-27 ENCOUNTER — Other Ambulatory Visit: Payer: 59

## 2019-02-27 ENCOUNTER — Other Ambulatory Visit: Payer: Self-pay

## 2019-02-27 ENCOUNTER — Ambulatory Visit
Admission: RE | Admit: 2019-02-27 | Discharge: 2019-02-27 | Disposition: A | Discharge status: Home / Self Care | Payer: 59 | Source: Ambulatory Visit | Attending: Surgery | Admitting: Surgery

## 2019-02-27 ENCOUNTER — Encounter: Payer: Self-pay | Admitting: Surgery

## 2019-02-27 VITALS — BP 133/88 | HR 99 | Temp 97.7°F | Resp 16 | Ht 69.0 in | Wt 198.0 lb

## 2019-02-27 DIAGNOSIS — I712 Thoracic aortic aneurysm, without rupture, unspecified: Secondary | ICD-10-CM

## 2019-02-27 DIAGNOSIS — I7121 Aneurysm of the ascending aorta, without rupture: Secondary | ICD-10-CM

## 2019-02-27 NOTE — Progress Notes (Signed)
HPI:  The patient is a 58 year old gentleman who returns today for follow-up of a fusiform ascending aortic aneurysm which was measured at 4.5 cm 1 year ago and was unchanged from a CT scan in 02/2015 and 02/2016.  He continues to feel well and denies any chest pain or shortness of breath.  He does have polycystic kidney disease and therefore we have not given him any contrast with his CT scan.  Current Outpatient Medications  Medication Sig Dispense Refill  . aspirin 81 MG tablet Take 81 mg by mouth daily.    Marland Kitchen atenolol (TENORMIN) 50 MG tablet Take 50 mg by mouth daily.     . fenofibrate 160 MG tablet Take 160 mg by mouth daily.    . insulin glargine (LANTUS) 100 UNIT/ML injection Inject 35 Units into the skin daily.    Marland Kitchen JYNARQUE 90 & 30 MG TBPK Patient reports taking 90mg  in the morning and 30mg  in the afternoon.    . simvastatin (ZOCOR) 40 MG tablet Take 40 mg by mouth daily.    Marland Kitchen telmisartan (MICARDIS) 20 MG tablet Take 20 mg by mouth daily.  1   No current facility-administered medications for this visit.      Physical Exam: BP 133/88 (BP Location: Right Arm, Patient Position: Sitting, Cuff Size: Normal)   Pulse 99   Temp 97.7 F (36.5 C)   Resp 16   Ht 5\' 9"  (1.753 m)   Wt 198 lb (89.8 kg)   SpO2 96% Comment: RA  BMI 29.24 kg/m  He looks well. Cardiac exam shows a regular rate and rhythm with normal heart sounds.  There is no murmur. Lungs are clear.   Diagnostic Tests:  CLINICAL DATA:  Thoracic aortic aneurysm.  EXAM: CT CHEST WITHOUT CONTRAST  TECHNIQUE: Multidetector CT imaging of the chest was performed following the standard protocol without IV contrast.  COMPARISON:  CT chest dated February 21, 2018.  FINDINGS: Cardiovascular: The ascending thoracic aorta measures approximately 4.6 cm (previously measuring 4.5 cm on February 21, 2018). There are mild atherosclerotic changes of the thoracic aorta. Coronary artery calcifications are noted. The  heart size is normal.  Mediastinum/Nodes:  --No mediastinal or hilar lymphadenopathy.  --No axillary lymphadenopathy.  --No supraclavicular lymphadenopathy.  --Normal thyroid gland.  --there is mild wall thickening of the distal esophagus which is essentially stable from prior study.  Lungs/Pleura: No pulmonary nodules or masses. No pleural effusion or pneumothorax. No focal airspace consolidation. No focal pleural abnormality.  Upper Abdomen: There are innumerable cysts involving both kidneys consistent with polycystic kidney disease. Several of the cysts in the upper pole appear hyperdense and are incompletely characterized on this exam. These hyperdense cyst in the upper pole the right kidney appear stable across multiple prior years and are favored to be benign.  Musculoskeletal: No chest wall abnormality. No acute or significant osseous findings.  Review of the MIP images confirms the above findings.  IMPRESSION: 1. The ascending thoracic aorta measures approximately 4.6 cm (previously measuring 4.5 cm on February 21, 2018). Ascending thoracic aortic aneurysm. Recommend semi-annual imaging followup by CTA or MRA and referral to cardiothoracic surgery if not already obtained. This recommendation follows 2010 ACCF/AHA/AATS/ACR/ASA/SCA/SCAI/SIR/STS/SVM Guidelines for the Diagnosis and Management of Patients With Thoracic Aortic Disease. Circulation. 2010; 121: R678-L381. Aortic aneurysm NOS (ICD10-I71.9) 2. Mild wall thickening of the distal esophagus is essentially stable from prior study. Findings may be secondary to chronic esophagitis. 3. Polycystic kidney disease 4. Aortic atherosclerosis.  Aortic  Atherosclerosis (ICD10-I70.0).   Electronically Signed   By: Katherine Mantle M.D.   On: 02/27/2019 15:36  Impression:  He has a stable fusiform ascending aortic aneurysm that was measured at 4.6 cm on the most recent study and essentially  unchanged from his study 1 year ago it was measured at 4.5 cm.  This is still well below the surgical threshold of 5.5 cm.  I discussed the importance of continued good blood pressure control and preventing further enlargement and acute aortic dissection.  He is on a beta-blocker and ARB.  I reviewed the CT images with him and answered all of his questions.  Plan:  I will see him back in 1 year with a CT scan of the chest without contrast since he has polycystic kidney disease.  I spent 15 minutes performing this established patient evaluation and > 50% of this time was spent face to face counseling and coordinating the care of this patient's aortic aneurysm.    Alleen Borne, MD [facility] [phone]

## 2019-03-12 ENCOUNTER — Encounter: Payer: Self-pay | Admitting: Cardiology

## 2019-03-12 ENCOUNTER — Other Ambulatory Visit: Payer: Self-pay

## 2019-03-12 ENCOUNTER — Ambulatory Visit (INDEPENDENT_AMBULATORY_CARE_PROVIDER_SITE_OTHER): Payer: 59 | Admitting: Cardiology

## 2019-03-12 VITALS — BP 110/70 | HR 63 | Ht 69.0 in | Wt 206.0 lb

## 2019-03-12 DIAGNOSIS — I2510 Atherosclerotic heart disease of native coronary artery without angina pectoris: Secondary | ICD-10-CM | POA: Diagnosis not present

## 2019-03-12 DIAGNOSIS — I2584 Coronary atherosclerosis due to calcified coronary lesion: Secondary | ICD-10-CM

## 2019-03-12 DIAGNOSIS — I700 Atherosclerosis of aorta: Secondary | ICD-10-CM

## 2019-03-12 DIAGNOSIS — I712 Thoracic aortic aneurysm, without rupture, unspecified: Secondary | ICD-10-CM

## 2019-03-12 DIAGNOSIS — I77810 Thoracic aortic ectasia: Secondary | ICD-10-CM | POA: Diagnosis not present

## 2019-03-12 NOTE — Patient Instructions (Signed)
Medication Instructions:  Your physician recommends that you continue on your current medications as directed. Please refer to the Current Medication list given to you today.  *If you need a refill on your cardiac medications before your next appointment, please call your pharmacy*  Testing/Procedures: Chest CT in 1 year.   Follow-Up: At [facility], you and your health needs are our priority.  As part of our continuing mission to provide you with exceptional heart care, we have created designated Provider Care Teams.  These Care Teams include your primary Cardiologist (physician) and Advanced Practice Providers (APPs -  Physician Assistants and Nurse Practitioners) who all work together to provide you with the care you need, when you need it.  Your next appointment:   1 year(s)  The format for your next appointment:   In Person  Provider:   Candee Furbish, MD

## 2019-03-12 NOTE — Progress Notes (Signed)
[address] [place], [place]  [address] Phone: [phone] Fax:  [phone]  Date:  03/12/2019   ID:  Carlos Joyce, DOB Aug 20, 1960, MRN [id number]  PCP:  Lupita Raider, MD   History of Present Illness: Carlos Joyce is a 58 y.o. male here for follow-up of ascending aortic aneurysm, 4.5 cm 02/2018, 11/16 and 11/17- 4.4 cm seen by Dr. Laneta Simmers in November 2015, 4.2 cm 2005. Close follow-up with CT scan in one year. Blood pressure under good control with atenolol and telmisartan.   Dilated aortic root stable.  4.5 -4.6 cm.  Has polycystic kidney disease.   CT scan also reveals three-vessel coronary artery calcification.  Overall no change in symptoms.  No chest pain, no shortness of breath, no syncope, no bleeding.  Unfortunately, his wife died after a battle with breast cancer since 2012.  Still grieving through this.  03/12/19 -here for follow-up of dilated aortic root 4.6 cm, stable on 02/27/2019.  No chest pain.  His son was diagnosed with Covid.  His whole household that tested and he was negative.  Wife died last year.  He let his diabetes get out of control at 1 point now he is back on track.  Feeling well no chest pain fevers chills nausea vomiting syncope bleeding.    Wt Readings from Last 3 Encounters:  03/12/19 206 lb (93.4 kg)  02/27/19 198 lb (89.8 kg)  03/06/18 215 lb (97.5 kg)     Past Medical History:  Diagnosis Date  . Aortic atherosclerosis (HCC) 03/07/2013  . Ascending aortic aneurysm (HCC)   . CKD (chronic kidney disease), stage III   . Diabetes mellitus (HCC)   . Essential hypertension 03/07/2013  . HBP (high blood pressure)   . Hyperlipemia 03/07/2013  . Hyperlipidemia   . Inguinal hernia recurrent bilateral   . PKD (polycystic kidney disease) 03/22/2013   Creat 1.4 10/14  . Polycystic kidney disease   . PUD (peptic ulcer disease)   . Sleep apnea    does not use CPAP    Past Surgical History:  Procedure Laterality Date   . HEMORRHOID SURGERY    . INGUINAL HERNIA REPAIR Bilateral 12/24/2014   Procedure: LAPAROSCOPIC BILATERAL INGUINAL HERNIA WITH MESH ;  Surgeon: Abigail Miyamoto, MD;  Location: [facility];  Service: General;  Laterality: Bilateral;  . UMBILICAL HERNIA REPAIR N/A 12/24/2014   Procedure: HERNIA REPAIR UMBILICAL ADULT;  Surgeon: Abigail Miyamoto, MD;  Location: [facility];  Service: General;  Laterality: N/A;    Current Outpatient Medications  Medication Sig Dispense Refill  . aspirin 81 MG tablet Take 81 mg by mouth daily.    Marland Kitchen atenolol (TENORMIN) 50 MG tablet Take 50 mg by mouth daily.     . fenofibrate 160 MG tablet Take 160 mg by mouth daily.    . insulin glargine (LANTUS) 100 UNIT/ML injection Inject 35 Units into the skin daily.    Marland Kitchen JYNARQUE 90 & 30 MG TBPK Patient reports taking 90mg  in the morning and 30mg  in the afternoon.    . simvastatin (ZOCOR) 40 MG tablet Take 40 mg by mouth daily.    telmisartan (MICARDIS) 20 MG tablet Take 20 mg by mouth daily.  1   No current facility-administered medications for this visit.     Allergies:   No Known Allergies  Social History:  The patient  reports that he has never smoked. He has never used smokeless  tobacco. He reports that he does not drink alcohol or use drugs.   Family History  Problem Relation Age of Onset  . Polycystic kidney disease Mother   . Polycystic kidney disease Brother   . Diabetes Sister     ROS:  Please see the history of present illness.   Denies any syncope, bleeding, chest pain, shortness of breath   All other systems reviewed and negative.   PHYSICAL EXAM: VS:  Pulse 63   Ht 5\' 9"  (1.753 m)   Wt 206 lb (93.4 kg)   BMI 30.42 kg/m  GEN: Well nourished, well developed, in no acute distress  HEENT: normal  Neck: no JVD, carotid bruits, or masses Cardiac: RRR; no murmurs, rubs, or gallops,no edema  Respiratory:  clear to auscultation bilaterally, normal work of breathing GI:  soft, nontender, nondistended, + BS MS: no deformity or atrophy  Skin: warm and dry, no rash Neuro:  Alert and Oriented x 3, Strength and sensation are intact Psych: euthymic mood, full affect   EKG:  Today 03/12/19 - NSR no change. 03/06/2018-sinus rhythm 04/29/16-sinus rhythm 81 nonspecific ST-T wave changes personally viewed-prior 04/21/15-sinus rhythm, 85, no other abnormalities personally viewed-previously 03/28/14-sinus rhythm, 68 with no other abnormalities.    CT chest 02/27/2019: 1. The ascending thoracic aorta measures approximately 4.6 cm (previously measuring 4.5 cm on February 21, 2018). Ascending thoracic aortic aneurysm.  CT chest: 03/11/15 IMPRESSION: 1. Stable to minimally increased diameter of previously demonstrated ascending thoracic aortic aneurysm now measuring 4.5 cm. Recommend followup by abdomen and pelvis CTA in 6 months, and vascular surgery referral/consultation if not already obtained. This recommendation follows ACR consensus guidelines: White Paper of the ACR Incidental Findings Committee II on Vascular Findings. J Am Coll Radiol 2013; 10:789-794. 2. Three vessel coronary artery disease. 3. Multiple renal cysts consistent with polycystic kidney disease.  CT chest 02/2016: IMPRESSION: 1. Unchanged appearance of descending thoracic aortic aneurysm. 2. Aortic and coronary artery atherosclerosis. 3. Diffusely enlarged kidneys with innumerable renal cysts of variable attenuation. Evaluation limited by field of view and lack of IV contrast.   NUC stress 12:24/16:  Low risk stress nuclear study with a small, moderate intensity, partially reversible apical defect consistent with apical thinning; cannot R/O very mild apical ischemia.   LV Ejection Fraction: 49%. LV Wall Motion: Mild global hypokinesis. Overall low risk study. Continue medical mgt.  ASSESSMENT AND PLAN:  1. Dilated aortic root-45-12mm stable Dr. Cyndia Bent has been seeing. He has repeat CT  scan in one year. Avoid power lifting. Atenolol-reducing shear stress. Avoid Cipro.  No changes made. 2. Coronary artery calcification-triple-vessel coronary artery calcification seen on CT scan November 2016. Continue with statin, beta blocker. Repeat in one year. No changes. No CP.  Once again no changes 3. Essential hypertension-well controlled.  Doing well excellent medications reviewed. No changes 4. Hyperlipidemia-continue with simvastatin.  Stable 5. Grieving- wife died with breast cancer earlier in 2019.  Still misses her of course 6. One year follow up.   Signed, Candee Furbish, MD [facility]  03/12/2019 4:06 PM

## 2019-05-22 ENCOUNTER — Encounter: Payer: Self-pay | Admitting: Radiology

## 2019-08-09 ENCOUNTER — Other Ambulatory Visit: Payer: Self-pay

## 2019-09-24 ENCOUNTER — Other Ambulatory Visit: Payer: Self-pay | Admitting: Adult Health

## 2019-09-24 DIAGNOSIS — U071 COVID-19: Secondary | ICD-10-CM

## 2019-09-24 MED ORDER — SODIUM CHLORIDE 0.9 % IV SOLN
Freq: Once | INTRAVENOUS | Status: AC
  Filled 2019-09-24: qty 700

## 2019-09-24 NOTE — Progress Notes (Signed)
°  I connected by phone with Carlos Joyce on 09/24/2019 at 4:40 PM to discuss the potential use of an new treatment for mild to moderate COVID-19 viral infection in non-hospitalized patients.  This patient is a 59 y.o. male that meets the FDA criteria for Emergency Use Authorization of bamlanivimab/etesevimab or casirivimab/imdevimab.  Has a (+) direct SARS-CoV-2 viral test result  Has mild or moderate COVID-19   Is NOT hospitalized due to COVID-19  Is within 10 days of symptom onset  Has at least one of the high risk factor(s) for progression to severe COVID-19 and/or hospitalization as defined in EUA.  Specific high risk criteria : Diabetes   I have spoken and communicated the following to the patient or parent/caregiver:  1. FDA has authorized the emergency use of bamlanivimab/etesevimab and casirivimab\imdevimab for the treatment of mild to moderate COVID-19 in adults and pediatric patients with positive results of direct SARS-CoV-2 viral testing who are 11 years of age and older weighing at least 40 kg, and who are at high risk for progressing to severe COVID-19 and/or hospitalization.  2. The significant known and potential risks and benefits of bamlanivimab/etesevimab and casirivimab\imdevimab, and the extent to which such potential risks and benefits are unknown.  3. Information on available alternative treatments and the risks and benefits of those alternatives, including clinical trials.  4. Patients treated with bamlanivimab/etesevimab and casirivimab\imdevimab should continue to self-isolate and use infection control measures (e.g., wear mask, isolate, social distance, avoid sharing personal items, clean and disinfect high touch surfaces, and frequent handwashing) according to CDC guidelines.   5. The patient or parent/caregiver has the option to accept or refuse bamlanivimab/etesevimab or casirivimab\imdevimab .  After reviewing this information with the patient, The patient  agreed to proceed with receiving the bamlanimivab infusion and will be provided a copy of the Fact sheet prior to receiving the infusion.[name] 09/24/2019 4:40 PM

## 2019-09-25 ENCOUNTER — Ambulatory Visit (HOSPITAL_COMMUNITY)
Admission: RE | Admit: 2019-09-25 | Discharge: 2019-09-25 | Disposition: A | Discharge status: Home / Self Care | Payer: 59 | Source: Ambulatory Visit | Attending: Pulmonary Disease | Admitting: Pulmonary Disease

## 2019-09-25 DIAGNOSIS — U071 COVID-19: Secondary | ICD-10-CM

## 2019-09-25 MED ORDER — METHYLPREDNISOLONE SODIUM SUCC 125 MG IJ SOLR: 125.0000 mg | Freq: Once | INTRAMUSCULAR | Status: DC | PRN

## 2019-09-25 MED ORDER — FAMOTIDINE IN NACL 20-0.9 MG/50ML-% IV SOLN: 20.0000 mg | Freq: Once | INTRAVENOUS | Status: DC | PRN

## 2019-09-25 MED ORDER — SODIUM CHLORIDE 0.9 % IV SOLN: INTRAVENOUS | Status: DC | PRN

## 2019-09-25 MED ORDER — ALBUTEROL SULFATE HFA 108 (90 BASE) MCG/ACT IN AERS: 2.0000 | INHALATION_SPRAY | Freq: Once | RESPIRATORY_TRACT | Status: DC | PRN

## 2019-09-25 MED ORDER — EPINEPHRINE 0.3 MG/0.3ML IJ SOAJ: 0.3000 mg | Freq: Once | INTRAMUSCULAR | Status: DC | PRN

## 2019-09-25 MED ORDER — DIPHENHYDRAMINE HCL 50 MG/ML IJ SOLN: 50.0000 mg | Freq: Once | INTRAMUSCULAR | Status: DC | PRN

## 2019-09-25 NOTE — Discharge Instructions (Signed)
10 Things You Can Do to Manage Your COVID-19 Symptoms at Home If you have possible or confirmed COVID-19: 1. Stay home from work and school. And stay away from other public places. If you must go out, avoid using any kind of public transportation, ridesharing, or taxis. 2. Monitor your symptoms carefully. If your symptoms get worse, call your healthcare provider immediately. 3. Get rest and stay hydrated. 4. If you have a medical appointment, call the healthcare provider ahead of time and tell them that you have or may have COVID-19. 5. For medical emergencies, call 911 and notify the dispatch personnel that you have or may have COVID-19. 6. Cover your cough and sneezes with a tissue or use the inside of your elbow. 7. Wash your hands often with soap and water for at least 20 seconds or clean your hands with an alcohol-based hand sanitizer that contains at least 60% alcohol. 8. As much as possible, stay in a specific room and away from other people in your home. Also, you should use a separate bathroom, if available. If you need to be around other people in or outside of the home, wear a mask. 9. Avoid sharing personal items with other people in your household, like dishes, towels, and bedding. 10. Clean all surfaces that are touched often, like counters, tabletops, and doorknobs. Use household cleaning sprays or wipes according to the label instructions. cdc.gov/coronavirus 10/17/2018 What types of side effects do monoclonal antibody drugs cause?  Common side effects  In general, the more common side effects caused by monoclonal antibody drugs include: . Allergic reactions, such as hives or itching . Flu-like signs and symptoms, including chills, fatigue, fever, and muscle aches and pains . Nausea, vomiting . Diarrhea . Skin rashes . Low blood pressure   The CDC is recommending patients who receive monoclonal antibody treatments wait at least 90 days before being vaccinated.  Currently,  there are no data on the safety and efficacy of mRNA COVID-19 vaccines in persons who received monoclonal antibodies or convalescent plasma as part of COVID-19 treatment. Based on the estimated half-life of such therapies as well as evidence suggesting that reinfection is uncommon in the 90 days after initial infection, vaccination should be deferred for at least 90 days, as a precautionary measure until additional information becomes available, to avoid interference of the antibody treatment with vaccine-induced immune responses. 

## 2019-09-25 NOTE — Progress Notes (Signed)
  Diagnosis: COVID-19  Physician: Dr. Wright  Procedure: [facility] Med: bamlanivimab\etesevimab infusion - Provided patient with bamlanimivab\etesevimab fact sheet for patients, parents and caregivers prior to infusion.  Complications: No immediate complications noted.  Discharge: Discharged home   Carlos Joyce 09/25/2019    

## 2020-02-13 ENCOUNTER — Other Ambulatory Visit: Payer: Self-pay | Admitting: Surgery

## 2020-02-13 DIAGNOSIS — I712 Thoracic aortic aneurysm, without rupture, unspecified: Secondary | ICD-10-CM

## 2020-03-04 ENCOUNTER — Ambulatory Visit
Admission: RE | Admit: 2020-03-04 | Discharge: 2020-03-04 | Disposition: A | Discharge status: Home / Self Care | Payer: 59 | Source: Ambulatory Visit | Attending: Surgery | Admitting: Surgery

## 2020-03-04 ENCOUNTER — Encounter: Payer: Self-pay | Admitting: Surgery

## 2020-03-04 ENCOUNTER — Ambulatory Visit: Payer: 59 | Admitting: Surgery

## 2020-03-04 ENCOUNTER — Other Ambulatory Visit: Payer: Self-pay

## 2020-03-04 VITALS — BP 119/78 | HR 67 | Temp 97.9°F | Resp 20 | Ht 69.0 in | Wt 194.8 lb

## 2020-03-04 DIAGNOSIS — I712 Thoracic aortic aneurysm, without rupture, unspecified: Secondary | ICD-10-CM

## 2020-03-04 DIAGNOSIS — I7121 Aneurysm of the ascending aorta, without rupture: Secondary | ICD-10-CM

## 2020-03-04 NOTE — Progress Notes (Signed)
HPI:  The patient is a 59 year old gentleman who returns for follow-up of a fusiform ascending aortic aneurysm that was measured at 4.6 cm 1 year ago and has been stable since November 2016.  He has a history of polycystic kidney disease followed by Dr. Arrie Aran and we have been doing CT scans without contrast.  He continues to feel well with no change in his medical history.  He has had no chest or back pain.  Current Outpatient Medications  Medication Sig Dispense Refill  . aspirin 81 MG tablet Take 81 mg by mouth daily.    Marland Kitchen atenolol (TENORMIN) 50 MG tablet Take 50 mg by mouth daily.     . fenofibrate 160 MG tablet Take 160 mg by mouth daily.    Marland Kitchen JYNARQUE 90 & 30 MG TBPK Patient reports taking 90mg  in the morning and 30mg  in the afternoon.    . simvastatin (ZOCOR) 40 MG tablet Take 40 mg by mouth daily.    telmisartan (MICARDIS) 20 MG tablet Take 20 mg by mouth daily.  1   No current facility-administered medications for this visit.     Physical Exam: BP 119/78 (BP Location: Right Arm, Patient Position: Sitting, Cuff Size: Normal)   Pulse 67   Temp 97.9 F (36.6 C) (Skin)   Resp 20   Ht 5\' 9"  (1.753 m)   Wt 194 lb 12.8 oz (88.4 kg)   SpO2 98% Comment: RA  BMI 28.77 kg/m  He looks well. Cardiac exam shows a regular rate and rhythm with normal heart sounds.  There is no murmur. Lungs are clear.  Diagnostic Tests:  CLINICAL DATA:  Follow-up thoracic aortic aneurysm.  EXAM: CT CHEST WITHOUT CONTRAST  TECHNIQUE: Multidetector CT imaging of the chest was performed following the standard protocol without IV contrast.  COMPARISON:  02/27/2019.  FINDINGS: Cardiovascular: The heart is normal in size. No pericardial effusion.  Stable fusiform aneurysmal dilatation of the ascending thoracic aorta. Maximal measurement is 4.4 cm on image 71/2. This previously measured approximately 4.6 cm. Difference could be due to cardiac cycle.  Stable scattered  atherosclerotic calcifications involving the descending thoracic aorta and the left subclavian artery. Stable age advanced three-vessel coronary artery calcifications.  Mediastinum/Nodes: No mediastinal or hilar mass or adenopathy. Small scattered lymph nodes are stable. The esophagus is grossly normal.  Lungs/Pleura: No acute pulmonary findings. No worrisome pulmonary lesions. No pulmonary nodules. No interstitial lung disease or bronchiectasis.  Upper Abdomen: Possible mild cirrhotic changes involving the liver and mild splenomegaly. Stable changes of adult polycystic kidney disease with innumerable simple and hemorrhagic renal cysts.  Musculoskeletal: No chest wall mass, supraclavicular or axillary adenopathy.  The bony thorax is intact.  IMPRESSION: 1. Stable fusiform aneurysmal dilatation of the ascending thoracic aorta with maximum measurement of 4.4 cm. This previously measured approximately 4.6 cm. Ascending thoracic aortic aneurysm. Recommend semi-annual imaging followup by CTA or MRA and referral to cardiothoracic surgery if not already obtained. This recommendation follows 2010 ACCF/AHA/AATS/ACR/ASA/SCA/SCAI/SIR/STS/SVM Guidelines for the Diagnosis and Management of Patients With Thoracic Aortic Disease. Circulation. 2010; 12113/02/2019. Aortic aneurysm NOS (ICD10-I71.9) 2. Stable age advanced three-vessel coronary artery calcifications. 3. Stable changes of adult polycystic kidney disease. 4. Possible mild cirrhotic changes involving the liver and mild splenomegaly. 5. Aortic atherosclerosis.  Aortic Atherosclerosis (ICD10-I70.0).   Electronically Signed   By: 2011 M.D.   On: 03/04/2020 10:24  Impression:  He has a stable 4.4 cm fusiform ascending aortic aneurysm that was  measured slightly less than it was last year when it was measured at 4.6 cm.  The difference is likely due to the scans being done in different phases of the cardiac cycle.   I reviewed the CT images with him and answered his questions.  His blood pressure is under good control and I stressed the importance of continued good blood pressure control and preventing further enlargement and acute aortic dissection.  His aneurysm is small and has been stable since 2016.  His blood pressure is under good control as I think it is reasonable to wait for 2 years to repeat his scan.  Plan:  He will return to see me in 2 years with a CT scan of the chest without contrast to follow-up on his ascending aortic aneurysm.  I spent 20 minutes performing this established patient evaluation and > 50% of this time was spent face to face counseling and coordinating the care of this patient's aortic aneurysm.    Alleen Borne, MD [facility] [phone]

## 2020-03-17 ENCOUNTER — Ambulatory Visit: Payer: 59 | Admitting: Cardiology

## 2020-03-17 ENCOUNTER — Encounter: Payer: Self-pay | Admitting: Cardiology

## 2020-03-17 ENCOUNTER — Other Ambulatory Visit: Payer: Self-pay

## 2020-03-17 VITALS — BP 116/80 | HR 70 | Ht 69.0 in | Wt 197.8 lb

## 2020-03-17 DIAGNOSIS — I712 Thoracic aortic aneurysm, without rupture, unspecified: Secondary | ICD-10-CM

## 2020-03-17 DIAGNOSIS — E785 Hyperlipidemia, unspecified: Secondary | ICD-10-CM | POA: Diagnosis not present

## 2020-03-17 DIAGNOSIS — I2584 Coronary atherosclerosis due to calcified coronary lesion: Secondary | ICD-10-CM

## 2020-03-17 DIAGNOSIS — I2510 Atherosclerotic heart disease of native coronary artery without angina pectoris: Secondary | ICD-10-CM | POA: Diagnosis not present

## 2020-03-17 MED ORDER — ROSUVASTATIN CALCIUM 20 MG PO TABS
20.0000 mg | ORAL_TABLET | Freq: Every day | ORAL | 3 refills | Status: DC
Start: 2020-03-17 — End: 2021-03-15

## 2020-03-17 NOTE — Patient Instructions (Signed)
Medication Instructions:  Please discontinue your Simvastatin and start Crestor 20 mg a day.  Continue all other medications as listed.  *If you need a refill on your cardiac medications before your next appointment, please call your pharmacy*  Lab Work: Please have blood work in 3 months (Lipid)  If you have labs (blood work) drawn today and your tests are completely normal, you will receive your results only by: Marland Kitchen MyChart Message (if you have MyChart) OR . A paper copy in the mail If you have any lab test that is abnormal or we need to change your treatment, we will call you to review the results.  Follow-Up: At [facility], you and your health needs are our priority.  As part of our continuing mission to provide you with exceptional heart care, we have created designated Provider Care Teams.  These Care Teams include your primary Cardiologist (physician) and Advanced Practice Providers (APPs -  Physician Assistants and Nurse Practitioners) who all work together to provide you with the care you need, when you need it.  We recommend signing up for the patient portal called "MyChart".  Sign up information is provided on this After Visit Summary.  MyChart is used to connect with patients for Virtual Visits (Telemedicine).  Patients are able to view lab/test results, encounter notes, upcoming appointments, etc.  Non-urgent messages can be sent to your provider as well.   To learn more about what you can do with MyChart, go to [email or web address].    Your next appointment:   12 month(s)  The format for your next appointment:   In Person  Provider:   Donato Schultz, MD

## 2020-03-17 NOTE — Progress Notes (Signed)
Cardiology Office Note:    Date:  03/17/2020   ID:  Martie Round, DOB 1960-12-24, MRN [id number]  PCP:  Lupita Raider, MD  [facility] Cardiologist:  Donato Schultz, MD  [facility] Electrophysiologist:  None   Referring MD: Lupita Raider, MD     History of Present Illness:    Carlos Joyce is a 59 y.o. male here for the follow-up of dilated aorta.  CT scan also shows three-vessel coronary artery calcification  Overall been doing quite well.  Watching his diabetes.  No fevers chills nausea vomiting syncope bleeding.   Past Medical History:  Diagnosis Date  . Aortic atherosclerosis (HCC) 03/07/2013  . Ascending aortic aneurysm (HCC)   . CKD (chronic kidney disease), stage III (HCC)   . Diabetes mellitus (HCC)   . Essential hypertension 03/07/2013  . HBP (high blood pressure)   . Hyperlipemia 03/07/2013  . Hyperlipidemia   . Inguinal hernia recurrent bilateral   . PKD (polycystic kidney disease) 03/22/2013   Creat 1.4 10/14  . Polycystic kidney disease   . PUD (peptic ulcer disease)   . Sleep apnea    does not use CPAP    Past Surgical History:  Procedure Laterality Date  . HEMORRHOID SURGERY    . INGUINAL HERNIA REPAIR Bilateral 12/24/2014   Procedure: LAPAROSCOPIC BILATERAL INGUINAL HERNIA WITH MESH ;  Surgeon: Abigail Miyamoto, MD;  Location: [facility];  Service: General;  Laterality: Bilateral;  . UMBILICAL HERNIA REPAIR N/A 12/24/2014   Procedure: HERNIA REPAIR UMBILICAL ADULT;  Surgeon: Abigail Miyamoto, MD;  Location: [facility];  Service: General;  Laterality: N/A;    Current Medications: Current Meds  Medication Sig  . aspirin 81 MG tablet Take 81 mg by mouth daily.  Marland Kitchen atenolol (TENORMIN) 50 MG tablet Take 50 mg by mouth daily.   . fenofibrate 160 MG tablet Take 160 mg by mouth daily.  Marland Kitchen JYNARQUE 90 & 30 MG TBPK Patient reports taking 90mg  in the morning and 30mg  in the afternoon.  telmisartan (MICARDIS) 20 MG tablet  Take 20 mg by mouth daily.  . [DISCONTINUED] simvastatin (ZOCOR) 40 MG tablet Take 40 mg by mouth daily.     Allergies:   Patient has no known allergies.   Social History   Socioeconomic History  . Marital status: Widowed    Spouse name: Not on file  . Number of children: 2  . Years of education: Not on file  . Highest education level: Not on file  Occupational History  . Occupation: : [employer]  Tobacco Use  . Smoking status: Never Smoker  . Smokeless tobacco: Never Used  Vaping Use  . Vaping Use: Never used  Substance and Sexual Activity  . Alcohol use: No  . Drug use: No  . Sexual activity: Yes  Other Topics Concern  . Not on file  Social History Narrative   Wife died in March 15, 2019from metastatic breast cancer   Social Determinants of Health   Financial Resource Strain:   . Difficulty of Paying Living Expenses: Not on file  Food Insecurity:   . Worried About Games developer in the Last Year: Not on file  . Ran Out of Food in the Last Year: Not on file  Transportation Needs:   . Lack of Transportation (Medical): Not on file  . Lack of Transportation (Non-Medical): Not on file  Physical Activity:   . Days of Exercise  per Week: Not on file  . Minutes of Exercise per Session: Not on file  Stress:   . Feeling of Stress : Not on file  Social Connections:   . Frequency of Communication with Friends and Family: Not on file  . Frequency of Social Gatherings with Friends and Family: Not on file  . Attends Religious Services: Not on file  . Active Member of Clubs or Organizations: Not on file  . Attends Banker Meetings: Not on file  . Marital Status: Not on file     Family History: The patient's family history includes Diabetes in his sister; Polycystic kidney disease in his brother and mother.  ROS:   Please see the history of present illness.     All other systems reviewed and are  negative.  EKGs/Labs/Other Studies Reviewed:    The following studies were reviewed today: CT scan personally reviewed  EKG:  EKG is  ordered today.  The ekg ordered today demonstrates sinus rhythm 70  Recent Labs: No results found for requested labs within last 8760 hours.  Recent Lipid Panel No results found for: CHOL, TRIG, HDL, CHOLHDL, VLDL, LDLCALC, LDLDIRECT   Risk Assessment/Calculations:       Physical Exam:    VS:  BP 116/80   Pulse 70   Ht 5\' 9"  (1.753 m)   Wt 197 lb 12.8 oz (89.7 kg)   SpO2 97%   BMI 29.21 kg/m     Wt Readings from Last 3 Encounters:  03/17/20 197 lb 12.8 oz (89.7 kg)  03/04/20 194 lb 12.8 oz (88.4 kg)  03/12/19 206 lb (93.4 kg)     GEN:  Well nourished, well developed in no acute distress HEENT: Normal NECK: No JVD; No carotid bruits LYMPHATICS: No lymphadenopathy CARDIAC: RRR, no murmurs, rubs, gallops RESPIRATORY:  Clear to auscultation without rales, wheezing or rhonchi  ABDOMEN: Soft, non-tender, non-distended MUSCULOSKELETAL:  No edema; No deformity  SKIN: Warm and dry NEUROLOGIC:  Alert and oriented x 3 PSYCHIATRIC:  Normal affect   ASSESSMENT:    1. Thoracic aortic aneurysm without rupture (HCC)   2. Coronary artery calcification   3. Hyperlipidemia, unspecified hyperlipidemia type    PLAN:    In order of problems listed above:  Thoracic aortic aneurysm, ascending -44 to 46 mm.  No change, stable over the past 2 years.  Reviewed Dr. 03/14/19 note from cardiothoracic surgery.  He will repeat CT scan in 2 years.  Excellent.  Continue with excellent blood pressure control.  Atenolol to reduce shear stress.  Coronary artery calcification -Last LDL 112, HDL 30, triglycerides 158 total cholesterol 171.  He has been on simvastatin 40 mg a day.  I will go ahead and change his simvastatin 40 over to Crestor 20 mg a day.  Repeat lipid panel in 3 months.  His liver function has been monitored closely by Dr. Sharee Pimple in  nephrology.  Essential hypertension -Well controlled.  Continue.  Polycystic kidney disease -Monitored closely by nephrology.  Currently on tolvaptan.   Shared Decision Making/Informed Consent        Medication Adjustments/Labs and Tests Ordered: Current medicines are reviewed at length with the patient today.  Concerns regarding medicines are outlined above.  Orders Placed This Encounter  Procedures  . Lipid panel  . EKG 12-Lead   Meds ordered this encounter  Medications  . rosuvastatin (CRESTOR) 20 MG tablet    Sig: Take 1 tablet (20 mg total) by mouth daily.    Dispense:  90 tablet    Refill:  3    Patient Instructions  Medication Instructions:  Please discontinue your Simvastatin and start Crestor 20 mg a day.  Continue all other medications as listed.  *If you need a refill on your cardiac medications before your next appointment, please call your pharmacy*  Lab Work: Please have blood work in 3 months (Lipid)  If you have labs (blood work) drawn today and your tests are completely normal, you will receive your results only by: Marland Kitchen MyChart Message (if you have MyChart) OR . A paper copy in the mail If you have any lab test that is abnormal or we need to change your treatment, we will call you to review the results.  Follow-Up: At [facility], you and your health needs are our priority.  As part of our continuing mission to provide you with exceptional heart care, we have created designated Provider Care Teams.  These Care Teams include your primary Cardiologist (physician) and Advanced Practice Providers (APPs -  Physician Assistants and Nurse Practitioners) who all work together to provide you with the care you need, when you need it.  We recommend signing up for the patient portal called "MyChart".  Sign up information is provided on this After Visit Summary.  MyChart is used to connect with patients for Virtual Visits (Telemedicine).  Patients are able to view  lab/test results, encounter notes, upcoming appointments, etc.  Non-urgent messages can be sent to your provider as well.   To learn more about what you can do with MyChart, go to [email or web address].    Your next appointment:   12 month(s)  The format for your next appointment:   In Person  Provider:   Donato Schultz, MD        Signed, Donato Schultz, MD  03/17/2020 4:36 PM    [facility]

## 2020-03-26 ENCOUNTER — Other Ambulatory Visit: Payer: Self-pay | Admitting: Nephrology

## 2020-03-26 DIAGNOSIS — N1832 Chronic kidney disease, stage 3b: Secondary | ICD-10-CM

## 2020-04-15 ENCOUNTER — Ambulatory Visit
Admission: RE | Admit: 2020-04-15 | Discharge: 2020-04-15 | Disposition: A | Discharge status: Home / Self Care | Payer: 59 | Source: Ambulatory Visit | Attending: Nephrology | Admitting: Nephrology

## 2020-04-15 DIAGNOSIS — N1832 Chronic kidney disease, stage 3b: Secondary | ICD-10-CM

## 2020-06-18 ENCOUNTER — Other Ambulatory Visit: Payer: 59

## 2020-06-26 ENCOUNTER — Other Ambulatory Visit: Payer: 59

## 2020-06-30 ENCOUNTER — Other Ambulatory Visit: Payer: Self-pay

## 2020-06-30 ENCOUNTER — Other Ambulatory Visit: Payer: 59 | Admitting: *Deleted

## 2020-06-30 DIAGNOSIS — E785 Hyperlipidemia, unspecified: Secondary | ICD-10-CM

## 2020-06-30 LAB — LIPID PANEL
Chol/HDL Ratio: 3.1 ratio (ref 0.0–5.0)
Cholesterol, Total: 113 mg/dL (ref 100–199)
HDL: 36 mg/dL — ABNORMAL LOW (ref >39)
LDL Chol Calc (NIH): 62 mg/dL (ref 0–99)
Triglycerides: 73 mg/dL (ref 0–149)
VLDL Cholesterol Cal: 15 mg/dL (ref 5–40)

## 2021-03-10 ENCOUNTER — Other Ambulatory Visit: Payer: Self-pay | Admitting: Cardiology

## 2021-03-31 ENCOUNTER — Other Ambulatory Visit: Payer: Self-pay | Admitting: Nephrology

## 2021-03-31 DIAGNOSIS — Q613 Polycystic kidney, unspecified: Secondary | ICD-10-CM

## 2021-04-12 ENCOUNTER — Other Ambulatory Visit: Payer: Self-pay | Admitting: Cardiology

## 2021-04-13 ENCOUNTER — Ambulatory Visit
Admission: RE | Admit: 2021-04-13 | Discharge: 2021-04-13 | Disposition: A | Discharge status: Home / Self Care | Payer: 59 | Source: Ambulatory Visit | Attending: Nephrology | Admitting: Nephrology

## 2021-04-13 DIAGNOSIS — Q613 Polycystic kidney, unspecified: Secondary | ICD-10-CM

## 2021-05-03 ENCOUNTER — Other Ambulatory Visit: Payer: Self-pay | Admitting: Cardiology

## 2021-08-16 NOTE — Progress Notes (Addendum)
Cardiology Office Note:    Date:  08/17/2021   ID:  Carlos Joyce, DOB 02-07-1961, MRN [id number]  PCP:  Mayra Neer, MD  [facility] Cardiologist:  Candee Furbish, MD  [facility] Electrophysiologist:  None   Referring MD: Mayra Neer, MD    History of Present Illness:    Carlos Joyce is a 61 y.o. male here for the follow-up of atrial fibrillation.  Previously here for follow-up of thoracic ascending aortic aneurysm 44 to 46 mm 2021  CT scan also shows three-vessel coronary artery calcification  At his last appointment he was doing quite well.  Watching his diabetes.    Today: He appears well overall. He denies any chest pains or shortness of breath at this time.  In 03/2021 he was referred to a dietician. His weight was 205 lbs at that visit. Since then he has successfully lost weight, currently 184 lbs in clinic today. He is monitoring his blood sugar and weight daily. He has made significant progress with dietary changes, eating healthier foods. Now he is avoiding fried foods and cooking more meals at home.  He follows up with his nephrologist every 4 months. Reportedly his most recent creatinine was 1.92.  He denies any palpitations, or peripheral edema. No lightheadedness, headaches, syncope, orthopnea, or PND.   Past Medical History:  Diagnosis Date   Aortic atherosclerosis ([place]) 03/07/2013   Ascending aortic aneurysm (HCC)    CKD (chronic kidney disease), stage III (HCC)    Diabetes mellitus ([place])    Essential hypertension 03/07/2013   HBP (high blood pressure)    Hyperlipemia 03/07/2013   Hyperlipidemia    Inguinal hernia recurrent bilateral    PKD (polycystic kidney disease) 03/22/2013   Creat 1.4 10/14   Polycystic kidney disease    PUD (peptic ulcer disease)    Sleep apnea    does not use CPAP    Past Surgical History:  Procedure Laterality Date   HEMORRHOID SURGERY     INGUINAL HERNIA REPAIR Bilateral 12/24/2014   Procedure: LAPAROSCOPIC  BILATERAL INGUINAL HERNIA WITH MESH ;  Surgeon: Coralie Keens, MD;  Location: [place];  Service: General;  Laterality: Bilateral;   UMBILICAL HERNIA REPAIR N/A 12/24/2014   Procedure: HERNIA REPAIR UMBILICAL ADULT;  Surgeon: Coralie Keens, MD;  Location: [place];  Service: General;  Laterality: N/A;    Current Medications: Current Meds  Medication Sig   aspirin 81 MG tablet Take 81 mg by mouth daily.   atenolol (TENORMIN) 50 MG tablet Take 50 mg by mouth daily.    fenofibrate 54 MG tablet Take 54 mg by mouth daily.   JYNARQUE 90 & 30 MG TBPK Patient reports taking 90mg  in the morning and 30mg  in the afternoon.   rosuvastatin (CRESTOR) 20 MG tablet TAKE 1 TABLET BY MOUTH ONCE A DAY   telmisartan (MICARDIS) 20 MG tablet Take 20 mg by mouth daily.   timolol (TIMOPTIC) 0.5 % ophthalmic solution Place 1 drop into the left eye daily.   triamcinolone cream (KENALOG) 0.1 % as needed.   [DISCONTINUED] fenofibrate 160 MG tablet Take 160 mg by mouth daily.     Allergies:   Patient has no known allergies.   Social History   Socioeconomic History   Marital status: Widowed    Spouse name: Not on file   Number of children: 2   Years of education: Not on file   Highest education level: Not on file  Occupational History  Occupation: Nurse, adult: [employer]  Tobacco Use   Smoking status: Never   Smokeless tobacco: Never  Vaping Use   Vaping Use: Never used  Substance and Sexual Activity   Alcohol use: No   Drug use: No   Sexual activity: Yes  Other Topics Concern   Not on file  Social History Narrative   Wife died in 2017/06/30 from metastatic breast cancer   Social Determinants of Health   Financial Resource Strain: Not on file  Food Insecurity: Not on file  Transportation Needs: Not on file  Physical Activity: Not on file  Stress: Not on file  Social Connections: Not on file     Family History: The  patient's family history includes Diabetes in his sister; Polycystic kidney disease in his brother and mother.  ROS:   Please see the history of present illness.    All other systems reviewed and are negative.  EKGs/Labs/Other Studies Reviewed:    The following studies were reviewed today: CT scan personally reviewed  CT Chest 03/04/2020: FINDINGS: Cardiovascular: The heart is normal in size. No pericardial effusion.   Stable fusiform aneurysmal dilatation of the ascending thoracic aorta. Maximal measurement is 4.4 cm on image 71/2. This previously measured approximately 4.6 cm. Difference could be due to cardiac cycle.   Stable scattered atherosclerotic calcifications involving the descending thoracic aorta and the left subclavian artery. Stable age advanced three-vessel coronary artery calcifications.   Mediastinum/Nodes: No mediastinal or hilar mass or adenopathy. Small scattered lymph nodes are stable. The esophagus is grossly normal.   Lungs/Pleura: No acute pulmonary findings. No worrisome pulmonary lesions. No pulmonary nodules. No interstitial lung disease or bronchiectasis.   Upper Abdomen: Possible mild cirrhotic changes involving the liver and mild splenomegaly. Stable changes of adult polycystic kidney disease with innumerable simple and hemorrhagic renal cysts.   Musculoskeletal: No chest wall mass, supraclavicular or axillary adenopathy.   The bony thorax is intact.   IMPRESSION: 1. Stable fusiform aneurysmal dilatation of the ascending thoracic aorta with maximum measurement of 4.4 cm. This previously measured approximately 4.6 cm. Ascending thoracic aortic aneurysm. Recommend semi-annual imaging followup by CTA or MRA and referral to cardiothoracic surgery if not already obtained. This recommendation follows 2010 ACCF/AHA/AATS/ACR/ASA/SCA/SCAI/SIR/STS/SVM Guidelines for the Diagnosis and Management of Patients With Thoracic Aortic Disease. Circulation.  2010; 121ML:[id number]. Aortic aneurysm NOS (ICD10-I71.9) 2. Stable age advanced three-vessel coronary artery calcifications. 3. Stable changes of adult polycystic kidney disease. 4. Possible mild cirrhotic changes involving the liver and mild splenomegaly. 5. Aortic atherosclerosis.   Aortic Atherosclerosis (ICD10-I70.0).  Nuclear Stress 04/10/2013: Low risk stress nuclear study with a small, moderate intensity, partially reversible apical defect consistent with apical thinning; cannot R/O very mild apical ischemia.   LV Ejection Fraction: 49%.  LV Wall Motion:  Mild global hypokinesis. Overall low risk study. Continue medical mgt.   EKG:  EKG is personally reviewed.  08/17/2021:  Sinus rhythm. Rate 62 bpm. 03/17/2020: sinus rhythm 70  Recent Labs: No results found for requested labs within last 8760 hours.   Recent Lipid Panel    Component Value Date/Time   CHOL 113 06/30/2020 0733   TRIG 73 06/30/2020 0733   HDL 36 (L) 06/30/2020 0733   CHOLHDL 3.1 06/30/2020 0733   LDLCALC 62 06/30/2020 0733     Risk Assessment/Calculations:       Physical Exam:    VS:  BP 114/68   Pulse 62   Ht 5\' 9"  (1.753  m)   Wt 184 lb 9.6 oz (83.7 kg)   SpO2 96%   BMI 27.26 kg/m     Wt Readings from Last 3 Encounters:  08/17/21 184 lb 9.6 oz (83.7 kg)  03/17/20 197 lb 12.8 oz (89.7 kg)  03/04/20 194 lb 12.8 oz (88.4 kg)     GEN:  Well nourished, well developed in no acute distress HEENT: Normal NECK: No JVD; No carotid bruits LYMPHATICS: No lymphadenopathy CARDIAC: RRR, no murmurs, rubs, gallops RESPIRATORY:  Clear to auscultation without rales, wheezing or rhonchi  ABDOMEN: Soft, non-tender, non-distended MUSCULOSKELETAL:  No edema; No deformity  SKIN: Warm and dry NEUROLOGIC:  Alert and oriented x 3 PSYCHIATRIC:  Normal affect   ASSESSMENT:    1. Coronary artery calcification   2. Aneurysm of ascending aorta without rupture ([place])   3. Aortic atherosclerosis ([place])   4. Pure  hypercholesterolemia   5. PKD (polycystic kidney disease)     PLAN:    Ascending aortic aneurysm (HCC) We will check a CT without contrast.  Previous scan was able to measure aorta sufficiently.  No chest pain.  Continue with excellent blood pressure control.  He continues with beta-blocker atenolol 50 mg a day as well as telmisartan 20 mg a day.  Aortic atherosclerosis Continue with statin therapy.  LDL 71 at last check.  On Crestor 20 mg a day  Hyperlipidemia On fenofibrate as well as Crestor.  If he is able to adequately control his triglycerides without fenofibrate through diet, he may potentially come off of fenofibrate in the future.  For now continue.  Continue with Crestor 20 mg a day, high intensity dose given his multivessel coronary artery calcifications personally reviewed and interpreted CT of the chest.  I showed him the images.  Expressed the importance of plaque stabilization, heart attack reduction.  PKD (polycystic kidney disease) Continuing to be monitored by nephrology.  Doing well.  On tolvaptan .  Addendum: CT scan stable 43 mm.   Shared Decision Making/Informed Consent       Follow-up:  1 year.  Medication Adjustments/Labs and Tests Ordered: Current medicines are reviewed at length with the patient today.  Concerns regarding medicines are outlined above.   Orders Placed This Encounter  Procedures   CT Chest Wo Contrast   EKG 12-Lead   No orders of the defined types were placed in this encounter.  Patient Instructions  Medication Instructions:  The current medical regimen is effective;  continue present plan and medications.  *If you need a refill on your cardiac medications before your next appointment, please call your pharmacy*  Testing/Procedures: CT of your chest (without contrast) is a noninvasive, special x-ray that produces cross-sectional images of the body using x-rays and a computer. CT scans help physicians diagnose and treat medical  conditions. For some CT exams, a contrast material is used to enhance visibility in the area of the body being studied. CT scans provide greater clarity and reveal more details than regular x-ray exams.  Follow-Up: At [facility], you and your health needs are our priority.  As part of our continuing mission to provide you with exceptional heart care, we have created designated Provider Care Teams.  These Care Teams include your primary Cardiologist (physician) and Advanced Practice Providers (APPs -  Physician Assistants and Nurse Practitioners) who all work together to provide you with the care you need, when you need it.  We recommend signing up for the patient portal called "MyChart".  Sign up information  is provided on this After Visit Summary.  MyChart is used to connect with patients for Virtual Visits (Telemedicine).  Patients are able to view lab/test results, encounter notes, upcoming appointments, etc.  Non-urgent messages can be sent to your provider as well.   To learn more about what you can do with MyChart, go to [email or web address].    Your next appointment:   1 year(s)  The format for your next appointment:   In Person  Provider:   Candee Furbish, MD {  Important Information About Sugar         I,Mathew Stumpf,acting as a scribe for Candee Furbish, MD.,have documented all relevant documentation on the behalf of Candee Furbish, MD,as directed by  Candee Furbish, MD while in the presence of Candee Furbish, MD.  I, Candee Furbish, MD, have reviewed all documentation for this visit. The documentation on 08/17/21 for the exam, diagnosis, procedures, and orders are all accurate and complete.   Signed, Candee Furbish, MD  08/17/2021 4:54 PM    [facility]

## 2021-08-17 ENCOUNTER — Encounter: Payer: Self-pay | Admitting: Cardiology

## 2021-08-17 ENCOUNTER — Ambulatory Visit: Payer: 59 | Admitting: Cardiology

## 2021-08-17 VITALS — BP 114/68 | HR 62 | Ht 69.0 in | Wt 184.6 lb

## 2021-08-17 DIAGNOSIS — I2584 Coronary atherosclerosis due to calcified coronary lesion: Secondary | ICD-10-CM | POA: Diagnosis not present

## 2021-08-17 DIAGNOSIS — I2510 Atherosclerotic heart disease of native coronary artery without angina pectoris: Secondary | ICD-10-CM | POA: Diagnosis not present

## 2021-08-17 DIAGNOSIS — I7121 Aneurysm of the ascending aorta, without rupture: Secondary | ICD-10-CM | POA: Diagnosis not present

## 2021-08-17 DIAGNOSIS — E7800 Pure hypercholesterolemia, unspecified: Secondary | ICD-10-CM | POA: Diagnosis not present

## 2021-08-17 DIAGNOSIS — I700 Atherosclerosis of aorta: Secondary | ICD-10-CM | POA: Diagnosis not present

## 2021-08-17 DIAGNOSIS — Q613 Polycystic kidney, unspecified: Secondary | ICD-10-CM

## 2021-08-17 NOTE — Assessment & Plan Note (Signed)
On fenofibrate as well as Crestor.  If he is able to adequately control his triglycerides without fenofibrate through diet, he may potentially come off of fenofibrate in the future.  For now continue.  Continue with Crestor 20 mg a day, high intensity dose given his multivessel coronary artery calcifications personally reviewed and interpreted CT of the chest.  I showed him the images.  Expressed the importance of plaque stabilization, heart attack reduction. ?

## 2021-08-17 NOTE — Patient Instructions (Signed)
Medication Instructions:  ?The current medical regimen is effective;  continue present plan and medications. ? ?*If you need a refill on your cardiac medications before your next appointment, please call your pharmacy* ? ?Testing/Procedures: ?CT of your chest (without contrast) is a noninvasive, special x-ray that produces cross-sectional images of the body using x-rays and a computer. CT scans help physicians diagnose and treat medical conditions. For some CT exams, a contrast material is used to enhance visibility in the area of the body being studied. CT scans provide greater clarity and reveal more details than regular x-ray exams. ? ?Follow-Up: ?At [facility], you and your health needs are our priority.  As part of our continuing mission to provide you with exceptional heart care, we have created designated Provider Care Teams.  These Care Teams include your primary Cardiologist (physician) and Advanced Practice Providers (APPs -  Physician Assistants and Nurse Practitioners) who all work together to provide you with the care you need, when you need it. ? ?We recommend signing up for the patient portal called "MyChart".  Sign up information is provided on this After Visit Summary.  MyChart is used to connect with patients for Virtual Visits (Telemedicine).  Patients are able to view lab/test results, encounter notes, upcoming appointments, etc.  Non-urgent messages can be sent to your provider as well.   ?To learn more about what you can do with MyChart, go to [email or web address].   ? ?Your next appointment:   ?1 year(s) ? ?The format for your next appointment:   ?In Person ? ?Provider:   ?Donato Schultz, MD { ? ?Important Information About Sugar ? ? ? ? ?  ?

## 2021-08-17 NOTE — Assessment & Plan Note (Signed)
Continuing to be monitored by nephrology.  Doing well.  On tolvaptan . ?

## 2021-08-17 NOTE — Assessment & Plan Note (Signed)
We will check a CT without contrast.  Previous scan was able to measure aorta sufficiently.  No chest pain.  Continue with excellent blood pressure control.  He continues with beta-blocker atenolol 50 mg a day as well as telmisartan 20 mg a day. ?

## 2021-08-17 NOTE — Assessment & Plan Note (Signed)
Continue with statin therapy.  LDL 71 at last check.  On Crestor 20 mg a day ?

## 2021-08-31 ENCOUNTER — Other Ambulatory Visit: Payer: Self-pay | Admitting: Cardiology

## 2021-09-01 ENCOUNTER — Ambulatory Visit (INDEPENDENT_AMBULATORY_CARE_PROVIDER_SITE_OTHER)
Admission: RE | Admit: 2021-09-01 | Discharge: 2021-09-01 | Disposition: A | Discharge status: Home / Self Care | Payer: 59 | Source: Ambulatory Visit | Attending: Cardiology | Admitting: Cardiology

## 2021-09-01 DIAGNOSIS — I7121 Aneurysm of the ascending aorta, without rupture: Secondary | ICD-10-CM | POA: Diagnosis not present

## 2022-01-17 ENCOUNTER — Other Ambulatory Visit: Payer: Self-pay | Admitting: Surgery

## 2022-01-17 DIAGNOSIS — I7121 Aneurysm of the ascending aorta, without rupture: Secondary | ICD-10-CM

## 2022-03-16 ENCOUNTER — Ambulatory Visit: Payer: 59 | Admitting: Surgery

## 2022-03-16 ENCOUNTER — Encounter: Payer: Self-pay | Admitting: Surgery

## 2022-03-16 ENCOUNTER — Ambulatory Visit
Admission: RE | Admit: 2022-03-16 | Discharge: 2022-03-16 | Disposition: A | Discharge status: Home / Self Care | Payer: 59 | Source: Ambulatory Visit | Attending: Surgery | Admitting: Surgery

## 2022-03-16 ENCOUNTER — Other Ambulatory Visit: Payer: 59

## 2022-03-16 VITALS — BP 119/77 | HR 67 | Resp 18 | Ht 69.0 in | Wt 184.0 lb

## 2022-03-16 DIAGNOSIS — I7121 Aneurysm of the ascending aorta, without rupture: Secondary | ICD-10-CM | POA: Diagnosis not present

## 2022-03-16 NOTE — Progress Notes (Signed)
HPI  The patient is a 61 year old gentleman who returns for follow-up of a fusiform ascending aortic aneurysm that was measured at 4.4 cm 1 year ago and has been stable in the 4.4-4.6 cm range since November 2016. He has a history of polycystic kidney disease followed by Dr. Marval Regal and we have been doing CT scans without contrast. He continues to feel well with no change in his medical history. He has had no chest or back pain.   Current Outpatient Medications  Medication Sig Dispense Refill   aspirin 81 MG tablet Take 81 mg by mouth daily.     atenolol (TENORMIN) 50 MG tablet Take 50 mg by mouth daily.      fenofibrate 54 MG tablet Take 54 mg by mouth daily.     JYNARQUE 90 & 30 MG TBPK Patient reports taking 90mg  in the morning and 30mg  in the afternoon.     rosuvastatin (CRESTOR) 20 MG tablet TAKE 1 TABLET BY MOUTH ONCE A DAY 90 tablet 1   telmisartan (MICARDIS) 20 MG tablet Take 20 mg by mouth daily.  1   timolol (TIMOPTIC) 0.5 % ophthalmic solution Place 1 drop into the left eye daily.     triamcinolone cream (KENALOG) 0.1 % as needed.     No current facility-administered medications for this visit.        Physical Exam BP 119/77 (BP Location: Left Arm, Patient Position: Sitting)   Pulse 67   Resp 18   Ht 5\' 9"  (1.753 m)   Wt 184 lb (83.5 kg)   SpO2 97% Comment: RA  BMI 27.17 kg/m  He looks well. Cardiac exam shows regular rate and rhythm with normal heart sounds.  There is no murmur. Lungs are clear.   Diagnostic Tests:  Narrative & Impression  CLINICAL DATA:  Aortic aneurysm suspected   EXAM: CT CHEST WITHOUT CONTRAST   TECHNIQUE: Multidetector CT imaging of the chest was performed following the standard protocol without IV contrast.   RADIATION DOSE REDUCTION: This exam was performed according to the departmental dose-optimization program which includes automated exposure control, adjustment of the mA and/or kV according to patient size and/or use  of iterative reconstruction technique.   COMPARISON:  09/01/2021, 03/04/2020   FINDINGS: Cardiovascular: Unenhanced imaging of the heart is unremarkable without pericardial effusion. Stable atherosclerosis of the coronary vasculature.   Evaluation of the thoracic aorta is limited without IV contrast. Stable 4.4 cm ascending thoracic aortic aneurysm, unchanged by my measurement. Stable atherosclerosis of the aortic arch.   Mediastinum/Nodes: No enlarged mediastinal or axillary lymph nodes. Thyroid gland, trachea, and esophagus demonstrate no significant findings.   Lungs/Pleura: No acute airspace disease, effusion, or pneumothorax. Central airways are patent.   Upper Abdomen: Polycystic kidney disease again noted. No acute upper abdominal findings.   Musculoskeletal: No acute or destructive bony lesions. Reconstructed images demonstrate no additional findings.   IMPRESSION: 1. Stable 4.4 cm ascending thoracic aortic aneurysm. Recommend annual imaging followup by CTA or MRA. This recommendation follows 2010 ACCF/AHA/AATS/ACR/ASA/SCA/SCAI/SIR/STS/SVM Guidelines for the Diagnosis and Management of Patients with Thoracic Aortic Disease. Circulation. 2010; 121ML:[id number]. Aortic aneurysm NOS (ICD10-I71.9) 2. Aortic Atherosclerosis (ICD10-I70.0). Coronary artery atherosclerosis.     Electronically Signed   By: Randa Ngo M.D.   On: 03/16/2022 14:55      Impression:  This 61 year old gentleman has a stable 4.4 cm fusiform ascending aortic aneurysm.  This is well below the surgical threshold of 5.5 cm.  I discussed the importance  of continued good blood pressure control in preventing further enlargement and acute aortic dissection.  I advised him against doing any heavy lifting that may require a Valsalva maneuver and could suddenly raise his blood pressure to high levels.   Plan:  I will see him back in 1 year with a CT scan of the chest without contrast for aortic  surveillance.  I spent 15 minutes performing this established patient evaluation and > 50% of this time was spent face to face counseling and coordinating the care of this patient's aortic aneurysm.   Alleen Borne, MD [facility] [phone]

## 2022-03-28 ENCOUNTER — Other Ambulatory Visit: Payer: Self-pay | Admitting: Cardiology

## 2022-06-27 ENCOUNTER — Other Ambulatory Visit: Payer: Self-pay | Admitting: Cardiology

## 2023-01-20 ENCOUNTER — Other Ambulatory Visit: Payer: Self-pay | Admitting: Cardiology

## 2023-01-25 ENCOUNTER — Other Ambulatory Visit: Payer: Self-pay | Admitting: Surgery

## 2023-01-25 DIAGNOSIS — I7121 Aneurysm of the ascending aorta, without rupture: Secondary | ICD-10-CM

## 2023-02-16 ENCOUNTER — Other Ambulatory Visit: Payer: Self-pay | Admitting: Cardiology

## 2023-02-28 ENCOUNTER — Other Ambulatory Visit: Payer: Self-pay | Admitting: Cardiology

## 2023-03-13 ENCOUNTER — Other Ambulatory Visit: Payer: Self-pay | Admitting: Cardiology

## 2023-03-15 ENCOUNTER — Other Ambulatory Visit: Payer: 59

## 2023-03-22 ENCOUNTER — Ambulatory Visit: Payer: 59 | Admitting: Surgery

## 2023-04-07 DIAGNOSIS — N183 Chronic kidney disease, stage 3 unspecified: Secondary | ICD-10-CM | POA: Diagnosis not present

## 2023-04-07 DIAGNOSIS — E1129 Type 2 diabetes mellitus with other diabetic kidney complication: Secondary | ICD-10-CM | POA: Diagnosis not present

## 2023-04-07 DIAGNOSIS — I129 Hypertensive chronic kidney disease with stage 1 through stage 4 chronic kidney disease, or unspecified chronic kidney disease: Secondary | ICD-10-CM | POA: Diagnosis not present

## 2023-04-07 DIAGNOSIS — Z0000 Encounter for general adult medical examination without abnormal findings: Secondary | ICD-10-CM | POA: Diagnosis not present

## 2023-04-07 DIAGNOSIS — Z125 Encounter for screening for malignant neoplasm of prostate: Secondary | ICD-10-CM | POA: Diagnosis not present

## 2023-04-07 DIAGNOSIS — E782 Mixed hyperlipidemia: Secondary | ICD-10-CM | POA: Diagnosis not present

## 2023-04-08 LAB — LAB REPORT - SCANNED
A1c: 7.7
Albumin, Urine POC: 6.49
Creatinine, POC: 21 mg/dL
EGFR: 34
Microalb Creat Ratio: 304.7

## 2023-04-14 ENCOUNTER — Telehealth: Payer: Self-pay | Admitting: Cardiology

## 2023-04-14 MED ORDER — ROSUVASTATIN CALCIUM 20 MG PO TABS: 20.0000 mg | ORAL_TABLET | Freq: Every day | ORAL | 0 refills | Status: DC

## 2023-04-14 NOTE — Telephone Encounter (Signed)
*  STAT* If patient is at the pharmacy, call can be transferred to refill team.   1. Which medications need to be refilled? (please list name of each medication and dose if known)   rosuvastatin (CRESTOR) 20 MG tablet     4. Which pharmacy/location (including street and city if local pharmacy) is medication to be sent to? [facility] - [place] - [address]     5. Do they need a 30 day or 90 day supply? 90   Pt is scheduled for next available 05/30/23 with Dr. Anne Fu

## 2023-04-26 ENCOUNTER — Other Ambulatory Visit: Payer: 59

## 2023-04-26 ENCOUNTER — Ambulatory Visit
Admission: RE | Admit: 2023-04-26 | Discharge: 2023-04-26 | Disposition: A | Discharge status: Home / Self Care | Payer: BC Managed Care – PPO | Source: Ambulatory Visit | Attending: Surgery | Admitting: Surgery

## 2023-04-26 DIAGNOSIS — I2510 Atherosclerotic heart disease of native coronary artery without angina pectoris: Secondary | ICD-10-CM | POA: Diagnosis not present

## 2023-04-26 DIAGNOSIS — I77811 Abdominal aortic ectasia: Secondary | ICD-10-CM | POA: Diagnosis not present

## 2023-04-26 DIAGNOSIS — J479 Bronchiectasis, uncomplicated: Secondary | ICD-10-CM | POA: Diagnosis not present

## 2023-04-26 DIAGNOSIS — I700 Atherosclerosis of aorta: Secondary | ICD-10-CM | POA: Diagnosis not present

## 2023-04-26 DIAGNOSIS — I7121 Aneurysm of the ascending aorta, without rupture: Secondary | ICD-10-CM

## 2023-05-03 ENCOUNTER — Ambulatory Visit: Payer: 59 | Admitting: Surgery

## 2023-05-20 DIAGNOSIS — H401121 Primary open-angle glaucoma, left eye, mild stage: Secondary | ICD-10-CM | POA: Diagnosis not present

## 2023-05-24 ENCOUNTER — Ambulatory Visit: Payer: BC Managed Care – PPO | Admitting: Surgery

## 2023-05-24 ENCOUNTER — Encounter: Payer: Self-pay | Admitting: Surgery

## 2023-05-24 VITALS — BP 144/80 | HR 75 | Resp 18 | Wt 195.0 lb

## 2023-05-24 DIAGNOSIS — I7121 Aneurysm of the ascending aorta, without rupture: Secondary | ICD-10-CM | POA: Diagnosis not present

## 2023-05-24 NOTE — Progress Notes (Signed)
 HPI:  The patient is a 63 year old gentleman who returns for follow-up of a stable 4.4 cm fusiform ascending aortic aneurysm dating back to 2016. He has a history of polycystic kidney disease followed by Dr. Rayburn and we have been doing CT scans without contrast. He continues to feel well with no change in his medical history. He has had no chest or back pain.   Current Outpatient Medications  Medication Sig Dispense Refill   aspirin 81 MG tablet Take 81 mg by mouth daily.     atenolol (TENORMIN) 50 MG tablet Take 50 mg by mouth daily.      JYNARQUE 90 & 30 MG TBPK Patient reports taking 90mg  in the morning and 30mg  in the afternoon.     rosuvastatin  (CRESTOR ) 20 MG tablet Take 1 tablet (20 mg total) by mouth daily. 90 tablet 0   timolol (TIMOPTIC) 0.5 % ophthalmic solution Place 1 drop into the left eye daily.     triamcinolone cream (KENALOG) 0.1 % as needed.     No current facility-administered medications for this visit.     Physical Exam: BP (!) 144/80 (BP Location: Right Arm)   Pulse 75   Resp 18   Wt 195 lb (88.5 kg)   SpO2 97% Comment: RA  BMI 28.80 kg/m  He looks well. Cardiac exam shows regular rate and rhythm with normal heart sounds.  There is no murmur. Lungs are clear.  Diagnostic Tests:  Narrative & Impression  CLINICAL DATA:  Aneurysm. Polycystic kidneys. Chronic kidney disease.   EXAM: CT CHEST WITHOUT CONTRAST   TECHNIQUE: Multidetector CT imaging of the chest was performed following the standard protocol without IV contrast.   RADIATION DOSE REDUCTION: This exam was performed according to the departmental dose-optimization program which includes automated exposure control, adjustment of the mA and/or kV according to patient size and/or use of iterative reconstruction technique.   COMPARISON:  CT 03/16/2022 and older   FINDINGS: Cardiovascular: Heart is nonenlarged. No pericardial effusion. Coronary artery calcifications are seen. Please  correlate for other coronary risk factors. Thoracic aorta has some scattered mild atherosclerotic calcified plaque. Previous diameter of the ascending aorta measured 4.4 cm and today when measured at the same level, level of the right pulmonary artery 4.5 cm. The descending thoracic aorta same level today measures 2.6 cm. Diameter of the aortic root is 3.6 cm. Distal aortic arch has a diameter of 2.7 cm.   Mediastinum/Nodes: No enlarged mediastinal or axillary lymph nodes. Thyroid gland, trachea, and esophagus demonstrate no significant findings.   Lungs/Pleura: No consolidation, pneumothorax or effusion. Mild right lower lobe bronchiectasis with some scarring atelectatic changes as well as some reticular changes peripherally. Overall appearance is similar to previous examination. These changes has been present since at least November 2021.   Upper Abdomen: Adrenal glands are slightly thickened, nonspecific. Once again polycystic kidney disease identified with multiple cysts bilaterally of varying size and density. Similar to previous.   Musculoskeletal: Scattered degenerative changes of the spine with osteophyte formation. Multilevel Schmorl's node deformities.   IMPRESSION: Ectasia of the ascending aorta measuring up to 4.5 cm today, similar to previous. Ascending thoracic aortic aneurysm. Recommend semi-annual imaging followup by CTA or MRA and referral to cardiothoracic surgery if not already obtained. This recommendation follows 2010 ACCF/AHA/AATS/ACR/ASA/SCA/SCAI/SIR/STS/SVM Guidelines for the Diagnosis and Management of Patients With Thoracic Aortic Disease. Circulation. 2010; 121: [id number]. Aortic aneurysm NOS (ICD10-I71.9)   Stable linear changes in the right lung base and bronchiectasis.  Coronary artery calcifications. Please correlate for other coronary risk factors   Aortic Atherosclerosis (ICD10-I70.0).     Electronically Signed   By: Ranell Bring M.D.    On: 04/26/2023 17:07    Impression:  He has a stable 4.5 cm fusiform ascending aortic aneurysm which has not changed since 2016. His aneurysm is still well below the surgical threshold of 5.5 cm.  I reviewed the CT images with him and answered all of his questions.  I stressed the importance of continued good blood pressure control in preventing further enlargement and acute aortic dissection.  I advised him against doing any heavy lifting that may require a Valsalva maneuver and could suddenly raise his blood pressure to high levels.   Plan:  He will return in 1 year for a CT of the chest without contrast.  I spent 10 minutes performing this established patient evaluation and > 50% of this time was spent face to face counseling and coordinating the care of this patient's aortic aneurysm.    Dorise MARLA Fellers, MD [facility] [phone]

## 2023-05-25 DIAGNOSIS — Z01818 Encounter for other preprocedural examination: Secondary | ICD-10-CM | POA: Diagnosis not present

## 2023-05-26 ENCOUNTER — Telehealth: Payer: Self-pay | Admitting: Cardiology

## 2023-05-26 NOTE — Telephone Encounter (Signed)
   Pre-operative Risk Assessment    Patient Name: Carlos Joyce  DOB: [birth year] MRN: [id number]   Date of last office visit: 08/17/2021 Date of next office visit: 08-22-2023   Request for Surgical Clearance    Procedure:   colonoscopy  Date of Surgery:  Clearance TBD                                Surgeon:  Dr. Jerrell Sol Surgeon's Group or Practice Name:  [facility] Phone number:  [phone] Fax number:  [phone]   Type of Clearance Requested:   - Medical  - Pharmacy:  Hold Aspirin (Aspirin was not indicated on the clearance request)   Type of Anesthesia:   propofol    Additional requests/questions:    Bonney Jonel LITTIE Smitty   05/26/2023, 4:20 PM

## 2023-05-29 NOTE — Telephone Encounter (Signed)
 Primary Cardiologist:Mark Renna Cary, MD  Chart reviewed as part of pre-operative protocol coverage. Because of Carlos Joyce's past medical history and time since last visit, he/she will require a follow-up visit in order to better assess preoperative cardiovascular risk.  Pre-op covering staff: - Please schedule appointment and call patient to inform them. - Please contact requesting surgeon's office via preferred method (i.e, phone, fax) to inform them of need for appointment prior to surgery.   Gerldine Koch, NP-C  05/29/2023, 11:00 AM [address] Office [phone] Fax [phone]

## 2023-05-30 ENCOUNTER — Ambulatory Visit: Payer: Self-pay | Admitting: Cardiology

## 2023-07-11 ENCOUNTER — Other Ambulatory Visit: Payer: Self-pay | Admitting: Cardiology

## 2023-07-20 ENCOUNTER — Other Ambulatory Visit: Payer: Self-pay | Admitting: Cardiology

## 2023-08-08 DIAGNOSIS — N1832 Chronic kidney disease, stage 3b: Secondary | ICD-10-CM | POA: Diagnosis not present

## 2023-08-10 IMAGING — US US RENAL
1 series · 14 of 25 positions shown · non-contrast
Comparison: 04/15/2020

CLINICAL DATA: Polycystic kidney disease

EXAM:
RENAL / URINARY TRACT ULTRASOUND COMPLETE

[Series 1: us renal · 0.28mm/px · 14 of 53 slices shown]
[im 1/53]
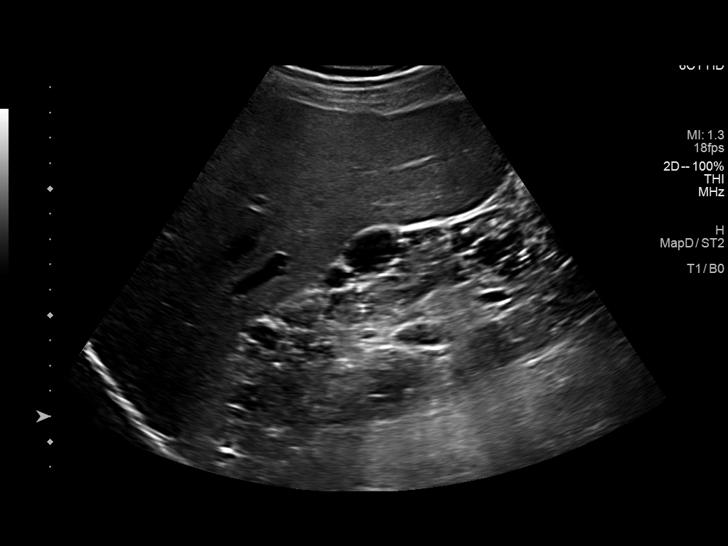
[im 5/53]
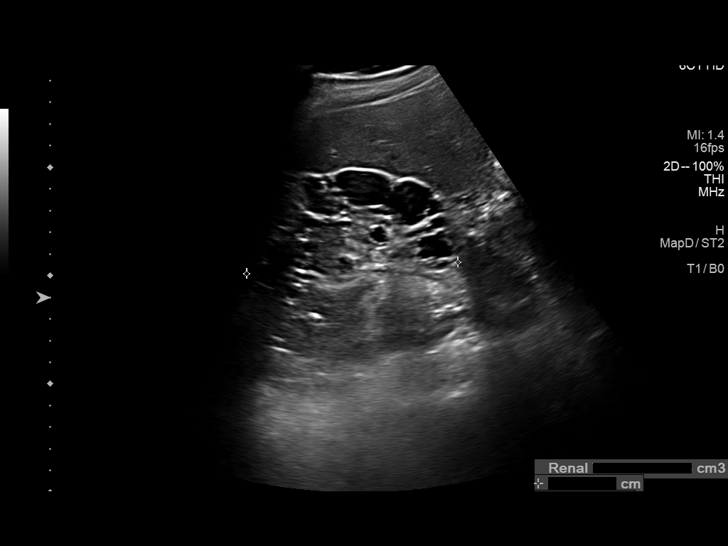
[im 9/53]
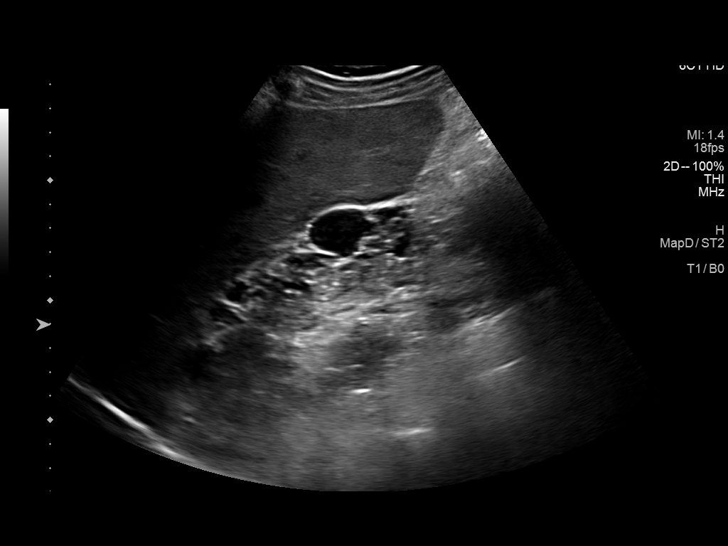
[im 14/53]
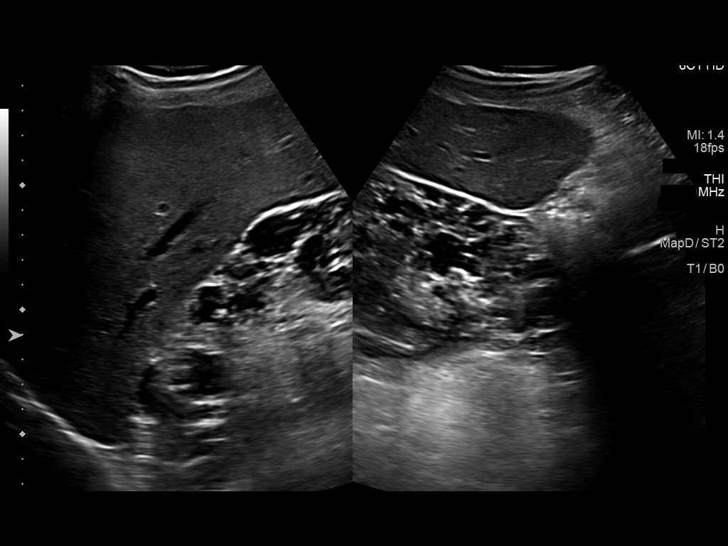
[im 18/53]
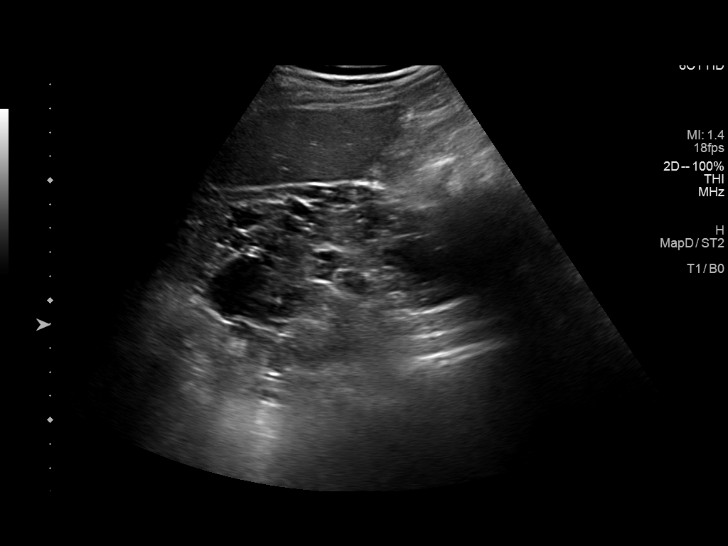
[im 20/53]
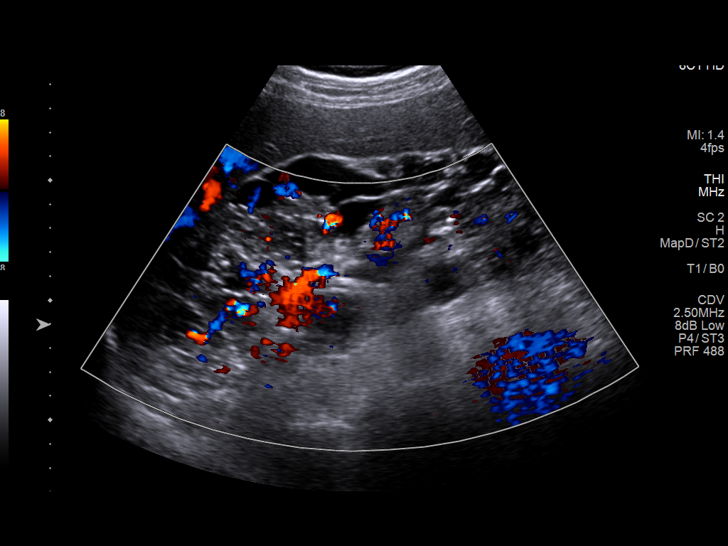
[im 24/53]
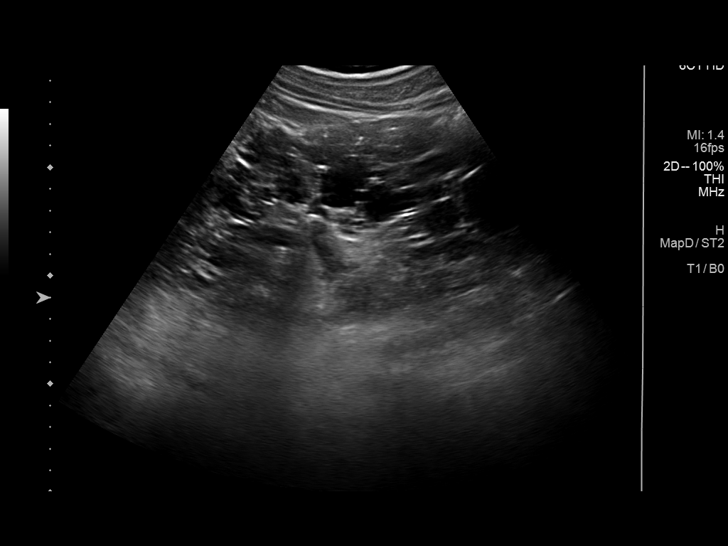
[im 29/53]
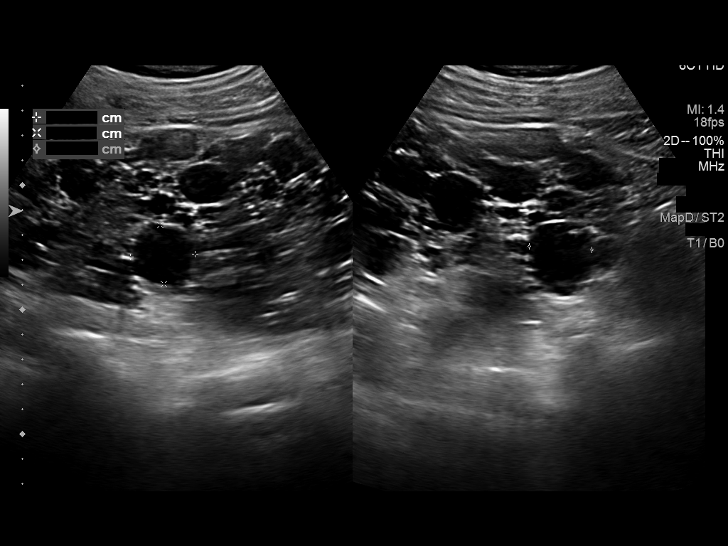
[im 33/53]
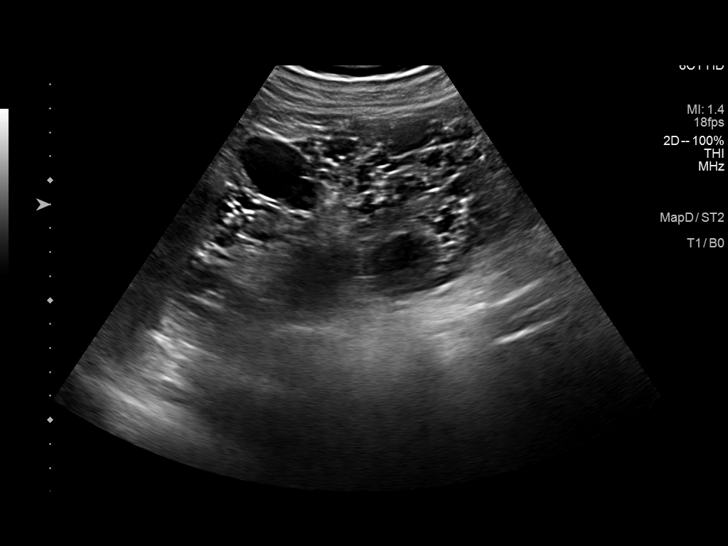
[im 35/53]
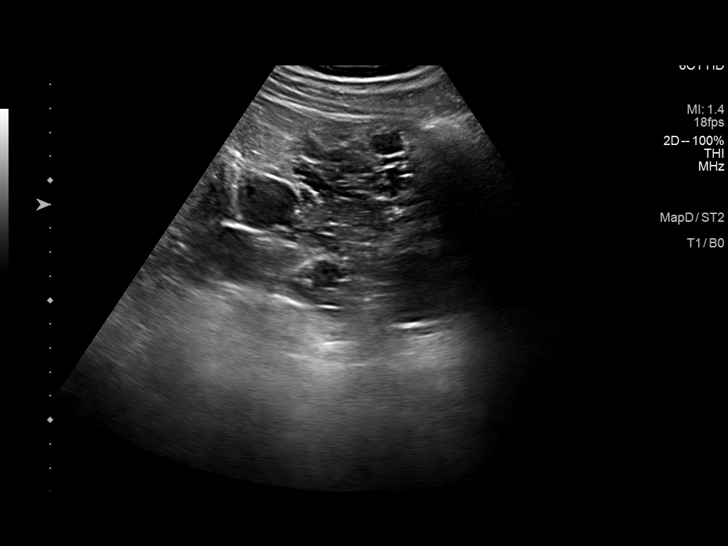
[im 40/53]
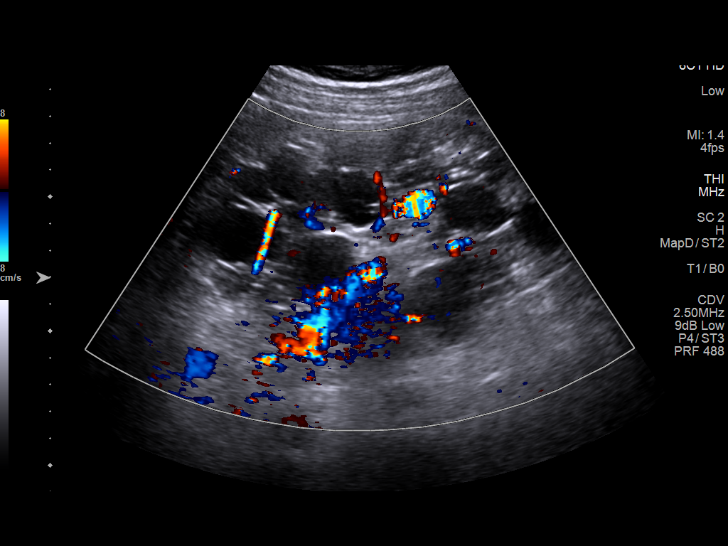
[im 44/53]
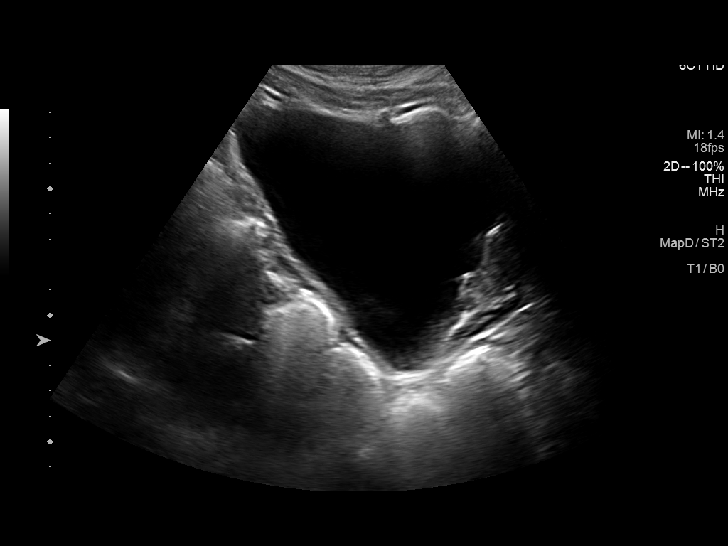
[im 48/53]
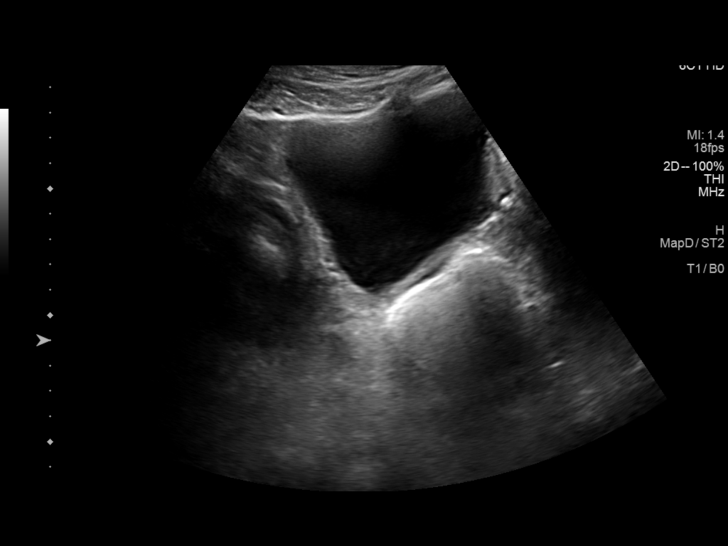
[im 53/53]
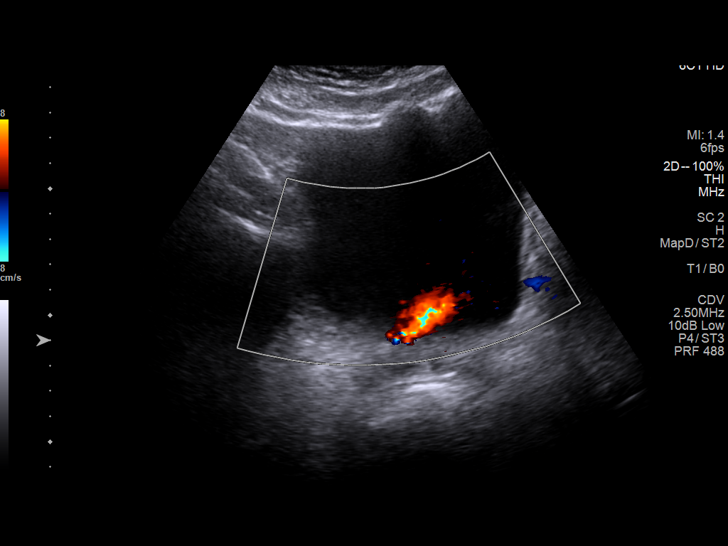

[14 of 25 positions shown; findings below may reference images not displayed]

FINDINGS: Right Kidney:

Renal measurements: 19.9 x 8.1 x 9.8 cm = volume: 827 mL. The renal
parenchyma is replaced by multiple cysts of various sizes consistent
with history of polycystic kidney. No hydronephrosis or solid mass
identified.

Left Kidney:

Renal measurements: 20.1 x 9.3 x 9 cm = volume: 883 mL. Renal
parenchyma is replaced by multiple cysts of various sizes consistent
with history of polycystic kidney. No hydronephrosis or solid mass
identified.

Bladder:

Appears normal for degree of bladder distention.

Other:

No significant change since previous study.
IMPRESSION: Bilateral polycystic renal disease.  No evidence of hydronephrosis.

## 2023-08-16 ENCOUNTER — Telehealth: Payer: Self-pay

## 2023-08-16 NOTE — Telephone Encounter (Signed)
   Pre-operative Risk Assessment    Patient Name: Carlos Joyce  DOB: 1960-12-05 MRN: [id number]   Date of last office visit: 08/17/21 Dorothye Gathers, MD Date of next office visit: 09/13/23 Dorothye Gathers, MD   Request for Surgical Clearance    Procedure:   COLONOSCOPY/ ENDOSCOPY  Date of Surgery:  Clearance 09/29/23                                Surgeon:  DR Baldo Bonds Surgeon's Group or Practice Name:  [facility] Phone number:  [phone] Fax number:  [phone]   Type of Clearance Requested:   - Medical  - Pharmacy:  Hold Aspirin     Type of Anesthesia:   PROPOFOL    Additional requests/questions:    SignedCollin Deal   08/16/2023, 4:49 PM

## 2023-08-17 NOTE — Telephone Encounter (Signed)
   Name: Carlos Joyce  DOB: 06-13-1960  MRN: [id number]  Primary Cardiologist: Dorothye Gathers, MD  Chart reviewed as part of pre-operative protocol coverage. The patient has an upcoming visit scheduled with Dr. Renna Cary on 09/13/23 at which time clearance can be addressed in case there are any issues that would impact surgical recommendations.  Colonoscopy/endoscopy is not scheduled until 09/29/23 as below. I added preop FYI to appointment note so that provider is aware to address at time of outpatient visit.  Per office protocol the cardiology provider should forward their finalized clearance decision and recommendations regarding antiplatelet therapy to the requesting party below.    - Will defer recommendations regarding holding of aspirin to primary cardiologist at office visit.   I will route this message as FYI to requesting party and remove this message from the preop box as separate preop APP input not needed at this time.   Please call with any questions.  Trudy Kory D Ledora Delker, NP  08/17/2023, 4:31 PM

## 2023-08-22 ENCOUNTER — Ambulatory Visit: Payer: Self-pay | Admitting: Cardiology

## 2023-09-02 DIAGNOSIS — H401121 Primary open-angle glaucoma, left eye, mild stage: Secondary | ICD-10-CM | POA: Diagnosis not present

## 2023-09-06 DIAGNOSIS — N1832 Chronic kidney disease, stage 3b: Secondary | ICD-10-CM | POA: Diagnosis not present

## 2023-09-13 ENCOUNTER — Ambulatory Visit: Payer: Self-pay | Attending: Cardiology | Admitting: Cardiology

## 2023-09-13 ENCOUNTER — Encounter: Payer: Self-pay | Admitting: Cardiology

## 2023-09-13 VITALS — BP 133/84 | HR 68 | Ht 69.0 in | Wt 194.6 lb

## 2023-09-13 DIAGNOSIS — I10 Essential (primary) hypertension: Secondary | ICD-10-CM | POA: Diagnosis not present

## 2023-09-13 DIAGNOSIS — I700 Atherosclerosis of aorta: Secondary | ICD-10-CM

## 2023-09-13 DIAGNOSIS — I7121 Aneurysm of the ascending aorta, without rupture: Secondary | ICD-10-CM

## 2023-09-13 DIAGNOSIS — Q613 Polycystic kidney, unspecified: Secondary | ICD-10-CM | POA: Diagnosis not present

## 2023-09-13 NOTE — Progress Notes (Signed)
 Cardiology Office Note:  .   Date:  09/13/2023  ID:  Carlos Joyce, DOB [birth year], MRN [id number] PCP: Glena Landau, MD  [facility] Providers Cardiologist:  Dorothye Gathers, MD     History of Present Illness: .   Carlos Joyce is a 63 y.o. male Discussed the use of AI scribe software for clinical note transcription with the patient, who gave verbal consent to proceed.  History of Present Illness Carlos Joyce is a 63 year old male with atrial fibrillation and thoracic aortic aneurysm who presents for follow-up.  He is being monitored for a thoracic aortic aneurysm, with measurements ranging from 44 to 46 millimeters, and most recently measured at 43 millimeters on a CT scan. No new symptoms related to this condition are present.  04/2023 CT personally reviewed and measured 45 mm.  Dr. Sherene Dilling.  He has a history of atrial fibrillation and is on Crestor  20 mg daily and atenolol 50 mg daily for management. No chest pain or shortness of breath.  He has polycystic kidney disease and is on tolvaptan. He sees a nephrologist approximately three times a year. Recently, his creatinine level increased to 2.77 but has since decreased to 2.13. He is unsure of the cause of the fluctuation but speculates it could be due to a virus or dehydration.  He has coronary calcification and is on rosuvastatin  to manage this condition. His LDL cholesterol was noted to be 145.    Studies Reviewed: Aaron Aas    EKG Interpretation Date/Time:  Wednesday Sep 13 2023 15:53:35 EDT Ventricular Rate:  68 PR Interval:  136 QRS Duration:  84 QT Interval:  392 QTC Calculation: 416 R Axis:   59  Text Interpretation: Normal sinus rhythm Nonspecific ST abnormality Abnormal QRS-T angle, consider primary T wave abnormality When compared with ECG of 02-May-2006 06:21, No significant change was found Confirmed by Dorothye Gathers ([id number]) on 09/13/2023 4:05:45 PM    Results LABS Creatinine: 2.77 (07/2023) Creatinine: 2.13  (08/2023) LDL: 145  RADIOLOGY CT scan: Thoracic aortic aneurysm 43 mm (08/2023) CT scan: Thoracic aortic aneurysm 4.5 cm (04/2023)  Risk Assessment/Calculations:            Physical Exam:   VS:  BP 133/84   Pulse 68   Ht 5\' 9"  (1.753 m)   Wt 194 lb 9.6 oz (88.3 kg)   SpO2 97%   BMI 28.74 kg/m    Wt Readings from Last 3 Encounters:  09/13/23 194 lb 9.6 oz (88.3 kg)  05/24/23 195 lb (88.5 kg)  03/16/22 184 lb (83.5 kg)    GEN: Well nourished, well developed in no acute distress NECK: No JVD; No carotid bruits CARDIAC: RRR, no murmurs, no rubs, no gallops RESPIRATORY:  Clear to auscultation without rales, wheezing or rhonchi  ABDOMEN: Soft, non-tender, non-distended EXTREMITIES:  No edema; No deformity   ASSESSMENT AND PLAN: .    Assessment and Plan Assessment & Plan Thoracic Aortic Aneurysm Thoracic aortic aneurysm measures 43 mm on the most recent CT scan, indicating mild to moderate dilation. Blood pressure control is essential to prevent further dilation. Imaging studies are performed without contrast to protect renal function. - Continue current antihypertensive regimen. - Schedule CT chest without contrast in one year to monitor aneurysm size.  Coronary Artery Calcification Coronary artery calcification with minimal plaque is managed with rosuvastatin  to stabilize plaque and prevent progression. LDL cholesterol level is likely better than reported due to rosuvastatin  therapy. - Continue rosuvastatin  20 mg daily.  Atrial Fibrillation Atrial fibrillation was not specifically addressed during this visit. No symptoms such as palpitations or irregular heartbeat were reported.  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 Possible causes include dehydration or viral illness, but no definitive cause identified. - Monitor renal function regularly. - Avoid contrast in imaging  studies to protect renal function.  Polycystic Kidney Disease Polycystic kidney disease is managed with tolvaptan. Regular nephrology follow-ups are in place. Recent creatinine fluctuation noted, possibly due to dehydration or viral illness. - Continue tolvaptan therapy. - Maintain regular nephrology follow-ups.  Pre op colonoscopy - OK to proceed from cardiology perspective.         Signed, Dorothye Gathers, MD

## 2023-09-13 NOTE — Patient Instructions (Signed)

## 2023-09-21 ENCOUNTER — Other Ambulatory Visit: Payer: Self-pay | Admitting: Family Medicine

## 2023-09-21 DIAGNOSIS — R319 Hematuria, unspecified: Secondary | ICD-10-CM

## 2023-09-22 ENCOUNTER — Ambulatory Visit
Admission: RE | Admit: 2023-09-22 | Discharge: 2023-09-22 | Disposition: A | Discharge status: Home / Self Care | Source: Ambulatory Visit | Attending: Family Medicine | Admitting: Family Medicine

## 2023-09-22 DIAGNOSIS — Q612 Polycystic kidney, adult type: Secondary | ICD-10-CM | POA: Diagnosis not present

## 2023-09-22 DIAGNOSIS — K4090 Unilateral inguinal hernia, without obstruction or gangrene, not specified as recurrent: Secondary | ICD-10-CM | POA: Diagnosis not present

## 2023-09-22 DIAGNOSIS — I700 Atherosclerosis of aorta: Secondary | ICD-10-CM | POA: Diagnosis not present

## 2023-09-22 DIAGNOSIS — K5730 Diverticulosis of large intestine without perforation or abscess without bleeding: Secondary | ICD-10-CM | POA: Diagnosis not present

## 2023-09-22 DIAGNOSIS — R319 Hematuria, unspecified: Secondary | ICD-10-CM

## 2023-09-25 DIAGNOSIS — T8521XA Breakdown (mechanical) of intraocular lens, initial encounter: Secondary | ICD-10-CM | POA: Diagnosis not present

## 2023-09-25 DIAGNOSIS — E119 Type 2 diabetes mellitus without complications: Secondary | ICD-10-CM | POA: Diagnosis not present

## 2023-09-25 DIAGNOSIS — I714 Abdominal aortic aneurysm, without rupture, unspecified: Secondary | ICD-10-CM | POA: Diagnosis not present

## 2023-09-25 DIAGNOSIS — H338 Other retinal detachments: Secondary | ICD-10-CM | POA: Diagnosis not present

## 2023-09-25 DIAGNOSIS — Z794 Long term (current) use of insulin: Secondary | ICD-10-CM | POA: Diagnosis not present

## 2023-09-25 DIAGNOSIS — H547 Unspecified visual loss: Secondary | ICD-10-CM | POA: Diagnosis not present

## 2023-09-25 DIAGNOSIS — Z8249 Family history of ischemic heart disease and other diseases of the circulatory system: Secondary | ICD-10-CM | POA: Diagnosis not present

## 2023-09-25 DIAGNOSIS — I10 Essential (primary) hypertension: Secondary | ICD-10-CM | POA: Diagnosis not present

## 2023-09-25 DIAGNOSIS — E1151 Type 2 diabetes mellitus with diabetic peripheral angiopathy without gangrene: Secondary | ICD-10-CM | POA: Diagnosis not present

## 2023-09-25 DIAGNOSIS — Z87891 Personal history of nicotine dependence: Secondary | ICD-10-CM | POA: Diagnosis not present

## 2023-09-25 DIAGNOSIS — H3322 Serous retinal detachment, left eye: Secondary | ICD-10-CM | POA: Diagnosis not present

## 2023-09-25 DIAGNOSIS — H43399 Other vitreous opacities, unspecified eye: Secondary | ICD-10-CM | POA: Diagnosis not present

## 2023-09-25 DIAGNOSIS — H33009 Unspecified retinal detachment with retinal break, unspecified eye: Secondary | ICD-10-CM | POA: Diagnosis not present

## 2023-09-26 DIAGNOSIS — H3322 Serous retinal detachment, left eye: Secondary | ICD-10-CM | POA: Diagnosis not present

## 2023-09-26 DIAGNOSIS — Z8669 Personal history of other diseases of the nervous system and sense organs: Secondary | ICD-10-CM | POA: Diagnosis not present

## 2023-09-26 DIAGNOSIS — Z961 Presence of intraocular lens: Secondary | ICD-10-CM | POA: Diagnosis not present

## 2023-09-28 DIAGNOSIS — H3322 Serous retinal detachment, left eye: Secondary | ICD-10-CM | POA: Diagnosis not present

## 2023-09-29 DIAGNOSIS — H3322 Serous retinal detachment, left eye: Secondary | ICD-10-CM | POA: Diagnosis not present

## 2023-10-05 DIAGNOSIS — H33002 Unspecified retinal detachment with retinal break, left eye: Secondary | ICD-10-CM | POA: Diagnosis not present

## 2023-10-05 DIAGNOSIS — H3322 Serous retinal detachment, left eye: Secondary | ICD-10-CM | POA: Diagnosis not present

## 2023-10-06 DIAGNOSIS — E1129 Type 2 diabetes mellitus with other diabetic kidney complication: Secondary | ICD-10-CM | POA: Diagnosis not present

## 2023-10-06 DIAGNOSIS — E782 Mixed hyperlipidemia: Secondary | ICD-10-CM | POA: Diagnosis not present

## 2023-10-06 DIAGNOSIS — N183 Chronic kidney disease, stage 3 unspecified: Secondary | ICD-10-CM | POA: Diagnosis not present

## 2023-10-06 DIAGNOSIS — F322 Major depressive disorder, single episode, severe without psychotic features: Secondary | ICD-10-CM | POA: Diagnosis not present

## 2023-10-06 DIAGNOSIS — I129 Hypertensive chronic kidney disease with stage 1 through stage 4 chronic kidney disease, or unspecified chronic kidney disease: Secondary | ICD-10-CM | POA: Diagnosis not present

## 2023-10-09 ENCOUNTER — Telehealth: Payer: Self-pay | Admitting: Pharmacist

## 2023-10-09 ENCOUNTER — Other Ambulatory Visit: Payer: Self-pay | Admitting: Cardiology

## 2023-10-09 NOTE — Progress Notes (Unsigned)
   10/09/2023  Patient ID: Carlos Joyce, male   DOB: 1960/05/12, 63 y.o.   MRN: [id number]  Received message from Dr. Loreli about getting insulin set-up for patient.  Called and discussed insulin start of Lantus with the patient. Confirmed understanding and will titrate 3 units every 3 days until reaching fasting of <130.  Advised I will call pharmacy in the morning to ensure coverage by insurance and patient should be able to get it in the afternoon as long as they have it in stock.  TE sent back to PCP regarding the above and advised cannot manage DM due to patient not being under [facility] contract.    Aloysius Lewis, PharmD [facility]  Phone Number: [phone]

## 2023-10-23 ENCOUNTER — Telehealth: Payer: Self-pay | Admitting: Pharmacist

## 2023-10-23 NOTE — Progress Notes (Signed)
   10/23/2023  Patient ID: Carlos Joyce, male   DOB: [birth year], 63 y.o.   MRN: [id number]  Called patient for follow-up call today for insulin titration. Patient reports since starting insulin, his blood sugars are looking even higher than before. Said BG readings are usually highest in the morning compared to the rest of his day.   Said he is up to 22 units daily and BG was 303 this morning. Could be dawn vs somogyi effect.   Advised to switch insulin from morning to nighttime. Continue titration as directed.  Will follow-up in 2 weeks and then allow Dr. Loreli to proceed going forward.   Aloysius Lewis, PharmD [facility]  Phone Number: [phone]

## 2023-11-02 DIAGNOSIS — H3322 Serous retinal detachment, left eye: Secondary | ICD-10-CM | POA: Diagnosis not present

## 2023-11-03 DIAGNOSIS — N1832 Chronic kidney disease, stage 3b: Secondary | ICD-10-CM | POA: Diagnosis not present

## 2023-11-06 ENCOUNTER — Telehealth: Payer: Self-pay | Admitting: Pharmacist

## 2023-11-06 NOTE — Progress Notes (Signed)
   11/06/2023  Patient ID: Carlos Joyce, male   DOB: 09-03-1960, 63 y.o.   MRN: [id number]  Called and spoke with the patient on the phone briefly while at work today. Reports his numbers are looking better since switching to taking the insulin at nighttime instead. Said they are sitting in the 200's. Will be increasing the dose to 34 U daily tomorrow- increasing every 3 days as directed. Unknown exact reading for 200's but will wait to see how follow-up goes with Dr. Loreli in 2 weeks and to see how he is doing with the titration at that time. Technical max based on weight is around 50U of insulin and should then add mealtime insulin to avoid over-basalization.    Aloysius Lewis, PharmD [facility]  Phone Number: [phone]

## 2023-11-21 DIAGNOSIS — I129 Hypertensive chronic kidney disease with stage 1 through stage 4 chronic kidney disease, or unspecified chronic kidney disease: Secondary | ICD-10-CM | POA: Diagnosis not present

## 2023-11-21 DIAGNOSIS — F322 Major depressive disorder, single episode, severe without psychotic features: Secondary | ICD-10-CM | POA: Diagnosis not present

## 2023-11-21 DIAGNOSIS — E1129 Type 2 diabetes mellitus with other diabetic kidney complication: Secondary | ICD-10-CM | POA: Diagnosis not present

## 2023-12-29 IMAGING — CT CT CHEST W/O CM
2 of 4 series · 15 of 36 positions shown, 18 images · non-contrast
Comparison: None Available.

CLINICAL DATA: Follow-up aortic aneurysm.



[Series 2: thorax · axial · 0.76mm/px · z∈[-530,-238]mm · 12 of 174 slices shown, 15 images]
[im 14/174  mediastinal]
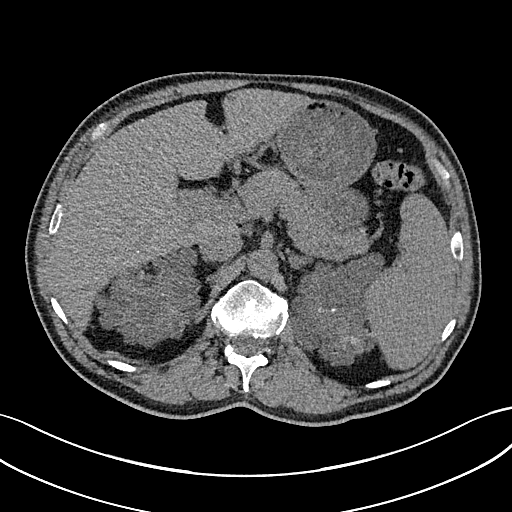
[im 14/174  lung]
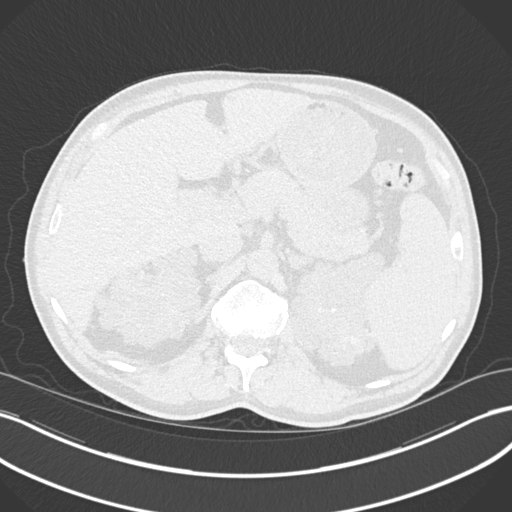
[im 27/174  lung]
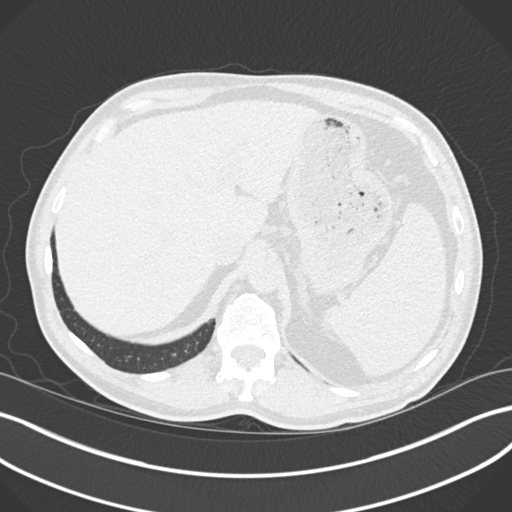
[im 40/174  lung]
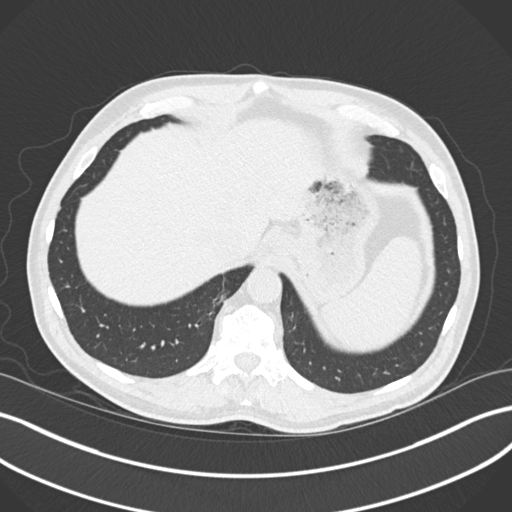
[im 54/174  lung]
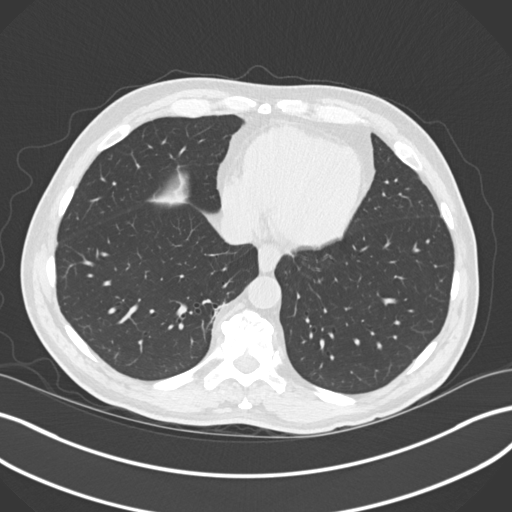
[im 67/174  mediastinal]
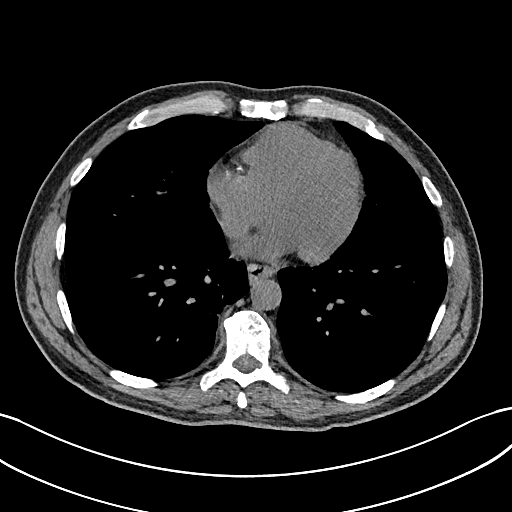
[im 67/174  lung]
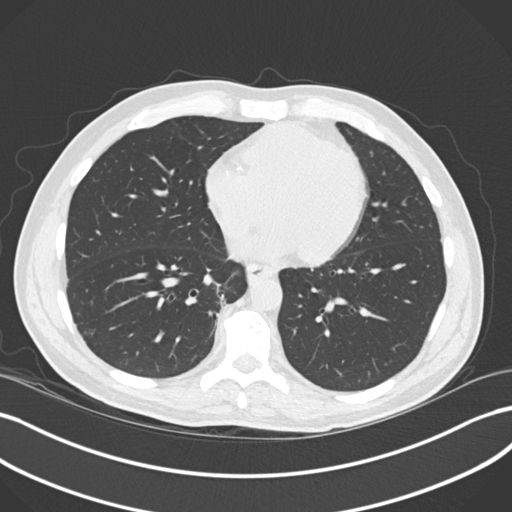
[im 80/174  lung]
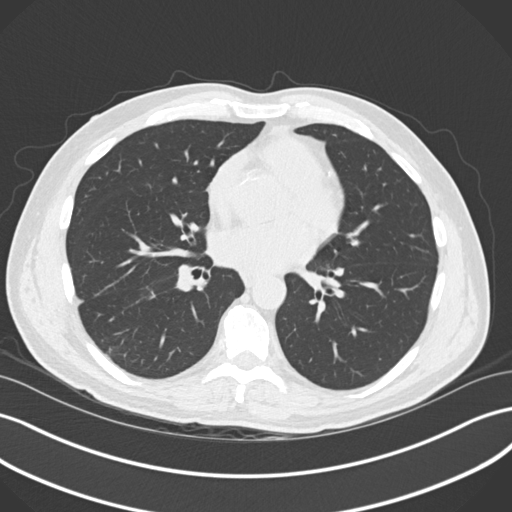
[im 94/174  lung]
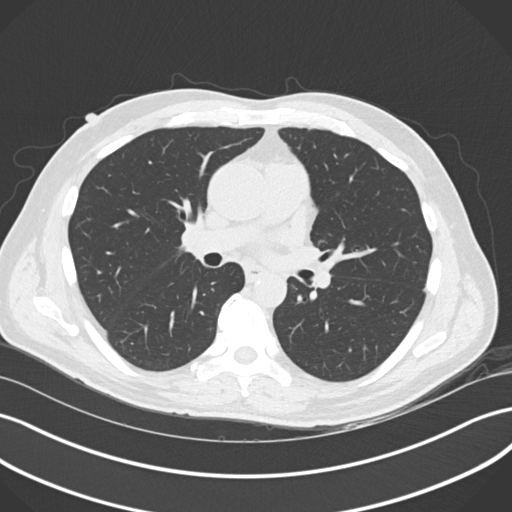
[im 107/174  lung]
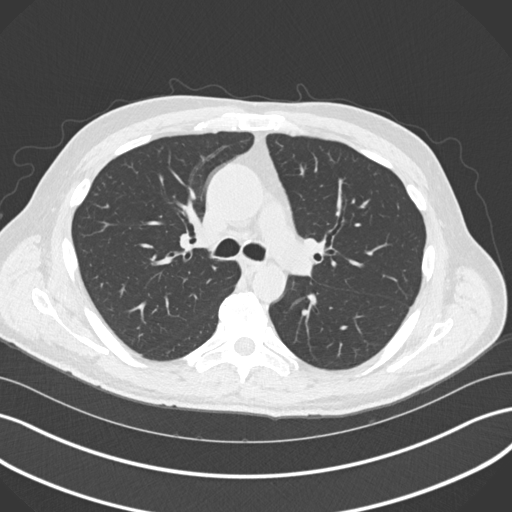
[im 120/174  mediastinal]
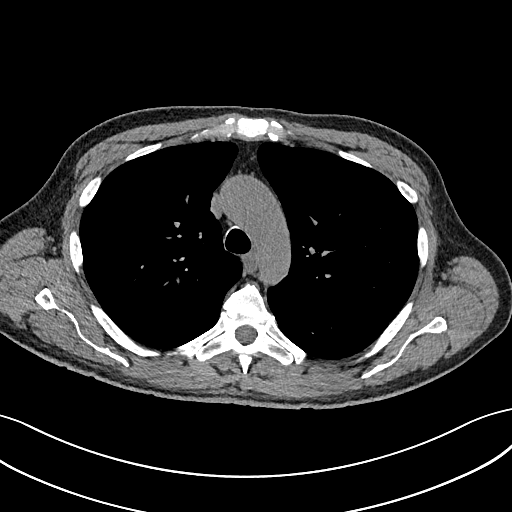
[im 120/174  lung]
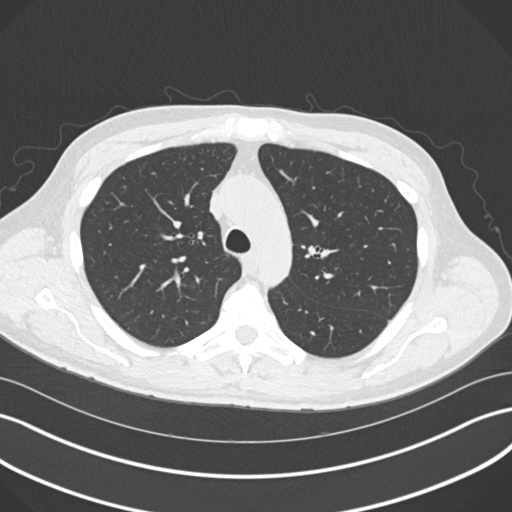
[im 134/174  lung]
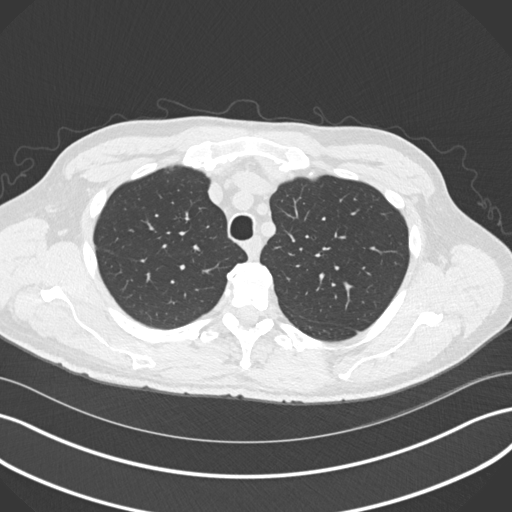
[im 147/174  lung]
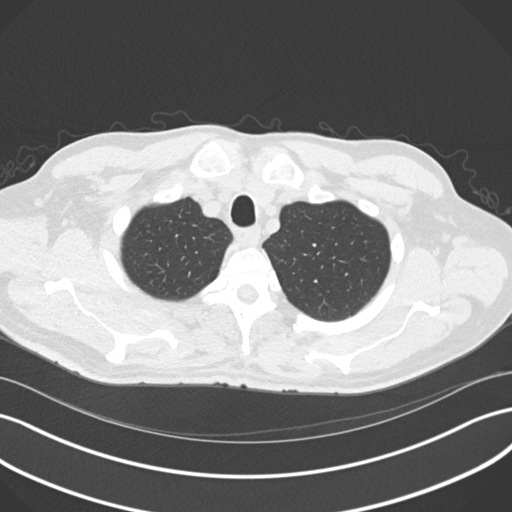
[im 160/174  lung]
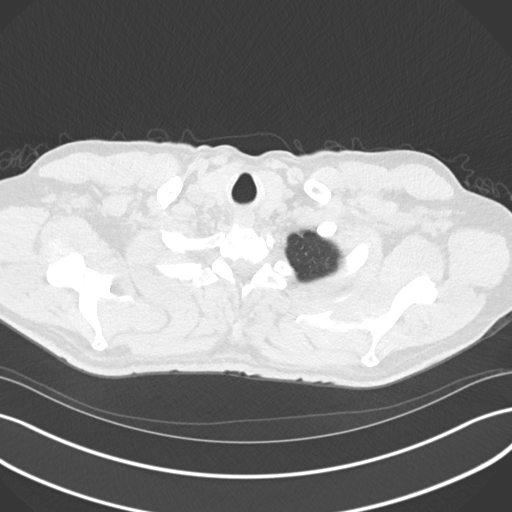

[Series 5: coronal · coronal · 0.68mm/px · 3 of 125 slices shown]
[im 25/125  lung]
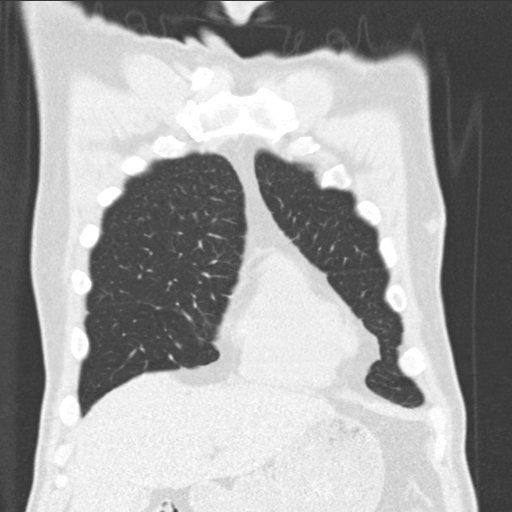
[im 50/125  lung]
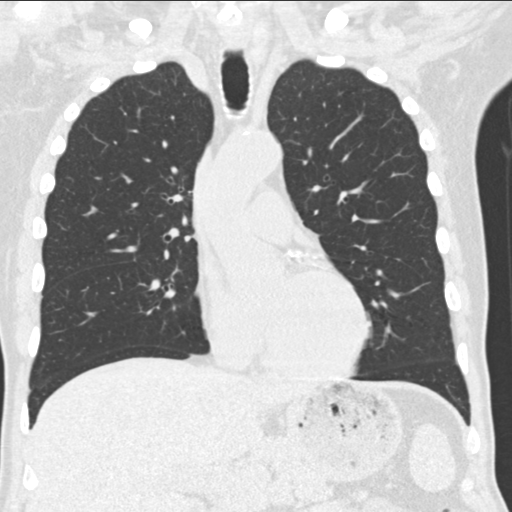
[im 75/125  lung]
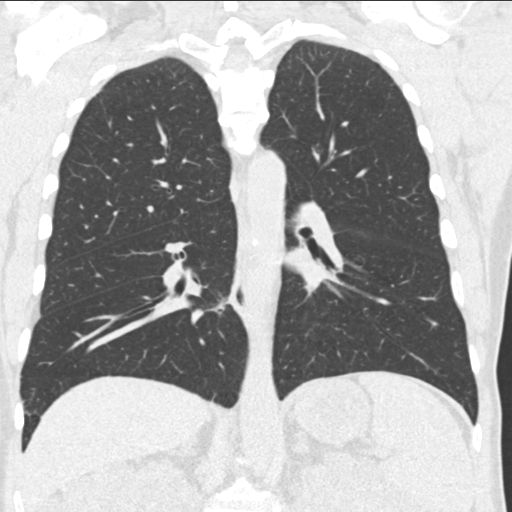

[15 of 36 positions shown; findings below may reference images not displayed]

FINDINGS: Cardiovascular: Measured in the same orientation as comparison CT
the ascending thoracic aorta measures 43 mm diameter (image 75/2)
compared to 44 mm.

Coronary artery calcification and aortic atherosclerotic
calcification.

Mediastinum/Nodes: No axillary or supraclavicular adenopathy. No
mediastinal or hilar adenopathy. No pericardial fluid. Esophagus
normal.

Lungs/Pleura: No suspicious pulmonary nodules. Normal pleural.
Airways normal.

Upper Abdomen: Limited view of the liver, kidneys, pancreas are
unremarkable. Normal adrenal glands.

Multi cystic kidney disease again noted.

Musculoskeletal: No aggressive osseous lesion.
IMPRESSION: 1. Stable aneurysmal dilatation of the ascending thoracic aorta 42
mm.
2. Coronary artery calcification and Aortic Atherosclerosis
(Q5SAO-M80.0).
3. Severe polycystic kidney disease.

## 2024-01-31 DIAGNOSIS — N1832 Chronic kidney disease, stage 3b: Secondary | ICD-10-CM | POA: Diagnosis not present

## 2024-03-04 ENCOUNTER — Telehealth (HOSPITAL_BASED_OUTPATIENT_CLINIC_OR_DEPARTMENT_OTHER): Payer: Self-pay

## 2024-03-04 NOTE — Telephone Encounter (Signed)
   Name: Carlos Joyce  DOB: 1960/11/15  MRN: [id number]  Primary Cardiologist: Oneil Parchment, MD   Preoperative team, please contact this patient and set up a phone call appointment for further preoperative risk assessment. Please obtain consent and complete medication review. Thank you for your help.  I confirm that guidance regarding antiplatelet and oral anticoagulation therapy has been completed and, if necessary, noted below.  Regarding ASA therapy, we recommend continuation of ASA throughout the perioperative period. However, if the surgeon feels that cessation of ASA is required in the perioperative period, it may be stopped 5-7 days prior to surgery with a plan to resume it as soon as felt to be feasible from a surgical standpoint in the post-operative period.    I also confirmed the patient resides in the state of [place] . As per [facility] Medical Board telemedicine laws, the patient must reside in the state in which the provider is licensed.   Josefa CHRISTELLA Beauvais, NP 03/04/2024, 1:47 PM [facility]

## 2024-03-04 NOTE — Telephone Encounter (Signed)
 Tried to call the pt to set up tele appt though no answer.

## 2024-03-04 NOTE — Telephone Encounter (Signed)
   Pre-operative Risk Assessment    Patient Name: Carlos Joyce  DOB: May 21, 1960 MRN: [id number]   Date of last office visit: 09/13/2023 with Dr. Jeffrie Date of next office visit: None  Request for Surgical Clearance    Procedure:  Colonoscopy  Date of Surgery:  Clearance 03/08/25                                 Surgeon:  Dr. Jerrell Sol Surgeon's Group or Practice Name:  [facility]  Phone number:  [phone] Fax number:  [phone]   Type of Clearance Requested:   - Medical  - Pharmacy:  Hold Aspirin - Does not specify   Type of Anesthesia:  Propofol    Additional requests/questions:  None  SignedPatrcia Iverson CROME   03/04/2024, 9:31 AM

## 2024-03-05 NOTE — Telephone Encounter (Signed)
 Tried to call the pt again today to schedule a tele preop appt, though again phone just keeps on ringing.   I will send FYI to requesting office pt needs a tele preop appt.

## 2024-03-06 ENCOUNTER — Ambulatory Visit: Attending: Physician Assistant | Admitting: Physician Assistant

## 2024-03-06 ENCOUNTER — Telehealth (HOSPITAL_BASED_OUTPATIENT_CLINIC_OR_DEPARTMENT_OTHER): Payer: Self-pay | Admitting: *Deleted

## 2024-03-06 DIAGNOSIS — Z01810 Encounter for preprocedural cardiovascular examination: Secondary | ICD-10-CM

## 2024-03-06 NOTE — Telephone Encounter (Signed)
 Pt called back and has been added today due to procedure 03/08/24. Med rec and consent are done.

## 2024-03-06 NOTE — Telephone Encounter (Signed)
 3rd attempt to reach the pt to schedule a tele preop appt. I will update the requesting office as FYI pt needs to call to schedule tele preop appt.

## 2024-03-06 NOTE — Telephone Encounter (Signed)
 Pt called back and has been added today due to procedure 03/08/24. Med rec and consent are done.     Patient Consent for Virtual Visit        Carlos Joyce has provided verbal consent on 03/06/2024 for a virtual visit (video or telephone).   CONSENT FOR VIRTUAL VISIT FOR:  Carlos Joyce  By participating in this virtual visit I agree to the following:  I hereby voluntarily request, consent and authorize [facility] and its employed or contracted physicians, physician assistants, nurse practitioners or other licensed health care professionals (the Practitioner), to provide me with telemedicine health care services (the "Services) as deemed necessary by the treating Practitioner. I acknowledge and consent to receive the Services by the Practitioner via telemedicine. I understand that the telemedicine visit will involve communicating with the Practitioner through live audiovisual communication technology and the disclosure of certain medical information by electronic transmission. I acknowledge that I have been given the opportunity to request an in-person assessment or other available alternative prior to the telemedicine visit and am voluntarily participating in the telemedicine visit.  I understand that I have the right to withhold or withdraw my consent to the use of telemedicine in the course of my care at any time, without affecting my right to future care or treatment, and that the Practitioner or I may terminate the telemedicine visit at any time. I understand that I have the right to inspect all information obtained and/or recorded in the course of the telemedicine visit and may receive copies of available information for a reasonable fee.  I understand that some of the potential risks of receiving the Services via telemedicine include:  Delay or interruption in medical evaluation due to technological equipment failure or disruption; Information transmitted may not be sufficient  (e.g. poor resolution of images) to allow for appropriate medical decision making by the Practitioner; and/or  In rare instances, security protocols could fail, causing a breach of personal health information.  Furthermore, I acknowledge that it is my responsibility to provide information about my medical history, conditions and care that is complete and accurate to the best of my ability. I acknowledge that Practitioner's advice, recommendations, and/or decision may be based on factors not within their control, such as incomplete or inaccurate data provided by me or distortions of diagnostic images or specimens that may result from electronic transmissions. I understand that the practice of medicine is not an exact science and that Practitioner makes no warranties or guarantees regarding treatment outcomes. I acknowledge that a copy of this consent can be made available to me via my patient portal [facility] MyChart), or I can request a printed copy by calling the office of [facility].    I understand that my insurance will be billed for this visit.   I have read or had this consent read to me. I understand the contents of this consent, which adequately explains the benefits and risks of the Services being provided via telemedicine.  I have been provided ample opportunity to ask questions regarding this consent and the Services and have had my questions answered to my satisfaction. I give my informed consent for the services to be provided through the use of telemedicine in my medical care

## 2024-03-06 NOTE — Progress Notes (Signed)
 Virtual Visit via Telephone Note   Because of Carlos Joyce co-morbid illnesses, he is at least at moderate risk for complications without adequate follow up.  This format is felt to be most appropriate for this patient at this time.  Due to technical limitations with video connection (technology), today's appointment will be conducted as an audio only telehealth visit, and WOODLEY PETZOLD verbally agreed to proceed in this manner.   All issues noted in this document were discussed and addressed.  No physical exam could be performed with this format.  Evaluation Performed:  Preoperative cardiovascular risk assessment _____________   Date:  03/06/2024   Patient ID:  Carlos Joyce, DOB [birth year], MRN [id number] Patient Location:  Home Provider location:   Office  Primary Care Provider:  Loreli Kins, MD Primary Cardiologist:  Oneil Parchment, MD  Chief Complaint / Patient Profile   63 y.o. y/o male with a h/o atrial fibrillation, hypertension, hyperlipidemia and thoracic aortic aneurysm who is pending colonoscopy and presents today for telephonic preoperative cardiovascular risk assessment.  History of Present Illness    Carlos Joyce is a 63 y.o. male who presents via audio/video conferencing for a telehealth visit today.  Pt was last seen in cardiology clinic on 09/13/23 by Dr. Parchment.  At that time NIKOLAY DEMETRIOU was doing well with no issues. Had to deal with a detached retina which is why his colonoscopy had to get rescheduled .  The patient is now pending procedure as outlined above. Since his last visit, he has been doing well without any issues with chest pain or SOB. No problems with the atrial fibrillation. He does meet minimum mets for clearance.   Regarding ASA therapy, we recommend continuation of ASA throughout the perioperative period. However, if the surgeon feels that cessation of ASA is required in the perioperative period, it may be stopped 5-7 days prior to surgery with a  plan to resume it as soon as felt to be feasible from a surgical standpoint in the post-operative period.  Past Medical History    Past Medical History:  Diagnosis Date   Aortic atherosclerosis 03/07/2013   Ascending aortic aneurysm    CKD (chronic kidney disease), stage III (HCC)    Diabetes mellitus (HCC)    Essential hypertension 03/07/2013   HBP (high blood pressure)    Hyperlipemia 03/07/2013   Hyperlipidemia    Inguinal hernia recurrent bilateral    PKD (polycystic kidney disease) 03/22/2013   Creat 1.4 10/14   Polycystic kidney disease    PUD (peptic ulcer disease)    Sleep apnea    does not use CPAP   Past Surgical History:  Procedure Laterality Date   HEMORRHOID SURGERY     INGUINAL HERNIA REPAIR Bilateral 12/24/2014   Procedure: LAPAROSCOPIC BILATERAL INGUINAL HERNIA WITH MESH ;  Surgeon: Vicenta Poli, MD;  Location: [facility];  Service: General;  Laterality: Bilateral;   UMBILICAL HERNIA REPAIR N/A 12/24/2014   Procedure: HERNIA REPAIR UMBILICAL ADULT;  Surgeon: Vicenta Poli, MD;  Location: [facility];  Service: General;  Laterality: N/A;    Allergies  No Known Allergies  Home Medications    Prior to Admission medications   Medication Sig Start Date End Date Taking? Authorizing Provider  aspirin 81 MG tablet Take 81 mg by mouth daily.    [provider]  atenolol (TENORMIN) 50 MG tablet Take 50 mg by mouth daily.     [provider]  LILTON  90 & 30 MG TBPK Patient reports taking 90mg  in the morning and 30mg  in the afternoon. 02/14/18   [provider]  LANTUS SOLOSTAR 100 UNIT/ML Solostar Pen Inject 100 Units into the skin daily. 10/09/23   [provider]  rosuvastatin  (CRESTOR ) 20 MG tablet TAKE ONE TABLET (20 MG TOTAL) BY MOUTH DAILY. 10/11/23   Jeffrie Oneil BROCKS, MD  sertraline (ZOLOFT) 50 MG tablet Take 50 mg by mouth daily.    [provider]  timolol (TIMOPTIC) 0.5 % ophthalmic  solution Place 1 drop into the left eye daily. 07/07/21   [provider]  triamcinolone cream (KENALOG) 0.1 % as needed. 06/01/21   [provider]    Physical Exam    Vital Signs:  SHONN FARRUGGIA does not have vital signs available for review today.  Given telephonic nature of communication, physical exam is limited. AAOx3. NAD. Normal affect.  Speech and respirations are unlabored.  Accessory Clinical Findings    None  Assessment & Plan    1.  Preoperative Cardiovascular Risk Assessment:  Mr. Si perioperative risk of a major cardiac event is 0.9% according to the Revised Cardiac Risk Index (RCRI).  Therefore, he is at low risk for perioperative complications.   His functional capacity is good at 6.61 METs according to the Duke Activity Status Index (DASI). Recommendations: According to ACC/AHA guidelines, no further cardiovascular testing needed.  The patient may proceed to surgery at acceptable risk.   Antiplatelet and/or Anticoagulation Recommendations: Aspirin can be held for 5-7 days prior to his surgery.  Please resume Aspirin post operatively when it is felt to be safe from a bleeding standpoint.   The patient was advised that if he develops new symptoms prior to surgery to contact our office to arrange for a follow-up visit, and he verbalized understanding.   A copy of this note will be routed to requesting surgeon.  Time:   Today, I have spent 6 minutes with the patient with telehealth technology discussing medical history, symptoms, and management plan.     Orren LOISE Fabry, PA-C  03/06/2024, 1:40 PM

## 2024-03-08 DIAGNOSIS — Z1211 Encounter for screening for malignant neoplasm of colon: Secondary | ICD-10-CM | POA: Diagnosis not present

## 2024-03-08 DIAGNOSIS — K5730 Diverticulosis of large intestine without perforation or abscess without bleeding: Secondary | ICD-10-CM | POA: Diagnosis not present

## 2024-03-18 DIAGNOSIS — K661 Hemoperitoneum: Secondary | ICD-10-CM | POA: Diagnosis not present

## 2024-03-18 DIAGNOSIS — J942 Hemothorax: Secondary | ICD-10-CM | POA: Diagnosis not present

## 2024-03-18 DIAGNOSIS — S61412A Laceration without foreign body of left hand, initial encounter: Secondary | ICD-10-CM | POA: Diagnosis not present

## 2024-03-18 DIAGNOSIS — S82831A Other fracture of upper and lower end of right fibula, initial encounter for closed fracture: Secondary | ICD-10-CM | POA: Diagnosis not present

## 2024-03-18 DIAGNOSIS — S80811A Abrasion, right lower leg, initial encounter: Secondary | ICD-10-CM | POA: Diagnosis not present

## 2024-03-18 DIAGNOSIS — S82832A Other fracture of upper and lower end of left fibula, initial encounter for closed fracture: Secondary | ICD-10-CM | POA: Diagnosis not present

## 2024-03-18 DIAGNOSIS — S32028A Other fracture of second lumbar vertebra, initial encounter for closed fracture: Secondary | ICD-10-CM | POA: Diagnosis not present

## 2024-03-18 DIAGNOSIS — M545 Low back pain, unspecified: Secondary | ICD-10-CM | POA: Diagnosis not present

## 2024-03-18 DIAGNOSIS — S3993XA Unspecified injury of pelvis, initial encounter: Secondary | ICD-10-CM | POA: Diagnosis not present

## 2024-03-18 DIAGNOSIS — S52502A Unspecified fracture of the lower end of left radius, initial encounter for closed fracture: Secondary | ICD-10-CM | POA: Diagnosis not present

## 2024-03-18 DIAGNOSIS — M25572 Pain in left ankle and joints of left foot: Secondary | ICD-10-CM | POA: Diagnosis not present

## 2024-03-18 DIAGNOSIS — S82401A Unspecified fracture of shaft of right fibula, initial encounter for closed fracture: Secondary | ICD-10-CM | POA: Diagnosis not present

## 2024-03-18 DIAGNOSIS — R1011 Right upper quadrant pain: Secondary | ICD-10-CM | POA: Diagnosis not present

## 2024-03-18 DIAGNOSIS — S32020A Wedge compression fracture of second lumbar vertebra, initial encounter for closed fracture: Secondary | ICD-10-CM | POA: Diagnosis not present

## 2024-03-18 DIAGNOSIS — M25571 Pain in right ankle and joints of right foot: Secondary | ICD-10-CM | POA: Diagnosis not present

## 2024-03-18 DIAGNOSIS — S0033XA Contusion of nose, initial encounter: Secondary | ICD-10-CM | POA: Diagnosis not present

## 2024-03-18 DIAGNOSIS — S271XXA Traumatic hemothorax, initial encounter: Secondary | ICD-10-CM | POA: Diagnosis not present

## 2024-03-18 DIAGNOSIS — S2243XA Multiple fractures of ribs, bilateral, initial encounter for closed fracture: Secondary | ICD-10-CM | POA: Diagnosis not present

## 2024-03-18 DIAGNOSIS — S52592A Other fractures of lower end of left radius, initial encounter for closed fracture: Secondary | ICD-10-CM | POA: Diagnosis not present

## 2024-03-18 DIAGNOSIS — R0789 Other chest pain: Secondary | ICD-10-CM | POA: Diagnosis not present

## 2024-03-18 DIAGNOSIS — Y92410 Unspecified street and highway as the place of occurrence of the external cause: Secondary | ICD-10-CM | POA: Diagnosis not present

## 2024-03-18 DIAGNOSIS — S0990XA Unspecified injury of head, initial encounter: Secondary | ICD-10-CM | POA: Diagnosis not present

## 2024-03-18 DIAGNOSIS — S80211A Abrasion, right knee, initial encounter: Secondary | ICD-10-CM | POA: Diagnosis not present

## 2024-03-18 DIAGNOSIS — S52612A Displaced fracture of left ulna styloid process, initial encounter for closed fracture: Secondary | ICD-10-CM | POA: Diagnosis not present

## 2024-03-18 DIAGNOSIS — S52572A Other intraarticular fracture of lower end of left radius, initial encounter for closed fracture: Secondary | ICD-10-CM | POA: Diagnosis not present

## 2024-03-18 DIAGNOSIS — T1490XA Injury, unspecified, initial encounter: Secondary | ICD-10-CM | POA: Diagnosis not present

## 2024-03-18 DIAGNOSIS — M25522 Pain in left elbow: Secondary | ICD-10-CM | POA: Diagnosis not present

## 2024-03-19 DIAGNOSIS — S52572A Other intraarticular fracture of lower end of left radius, initial encounter for closed fracture: Secondary | ICD-10-CM | POA: Diagnosis not present

## 2024-03-19 DIAGNOSIS — G8918 Other acute postprocedural pain: Secondary | ICD-10-CM | POA: Diagnosis not present

## 2024-03-19 DIAGNOSIS — T1490XA Injury, unspecified, initial encounter: Secondary | ICD-10-CM | POA: Diagnosis not present

## 2024-03-19 DIAGNOSIS — J90 Pleural effusion, not elsewhere classified: Secondary | ICD-10-CM | POA: Diagnosis not present

## 2024-03-20 DIAGNOSIS — T1490XA Injury, unspecified, initial encounter: Secondary | ICD-10-CM | POA: Diagnosis not present

## 2024-03-20 DIAGNOSIS — J90 Pleural effusion, not elsewhere classified: Secondary | ICD-10-CM | POA: Diagnosis not present

## 2024-03-21 DIAGNOSIS — J9811 Atelectasis: Secondary | ICD-10-CM | POA: Diagnosis not present

## 2024-03-21 DIAGNOSIS — J810 Acute pulmonary edema: Secondary | ICD-10-CM | POA: Diagnosis not present

## 2024-03-21 DIAGNOSIS — R079 Chest pain, unspecified: Secondary | ICD-10-CM | POA: Diagnosis not present

## 2024-03-21 DIAGNOSIS — J8489 Other specified interstitial pulmonary diseases: Secondary | ICD-10-CM | POA: Diagnosis not present

## 2024-03-21 DIAGNOSIS — R918 Other nonspecific abnormal finding of lung field: Secondary | ICD-10-CM | POA: Diagnosis not present

## 2024-03-25 DIAGNOSIS — S82831A Other fracture of upper and lower end of right fibula, initial encounter for closed fracture: Secondary | ICD-10-CM | POA: Diagnosis not present

## 2024-03-25 DIAGNOSIS — S32031A Stable burst fracture of third lumbar vertebra, initial encounter for closed fracture: Secondary | ICD-10-CM | POA: Diagnosis not present

## 2024-03-25 DIAGNOSIS — S52502A Unspecified fracture of the lower end of left radius, initial encounter for closed fracture: Secondary | ICD-10-CM | POA: Diagnosis not present

## 2024-03-25 DIAGNOSIS — S82001A Unspecified fracture of right patella, initial encounter for closed fracture: Secondary | ICD-10-CM | POA: Diagnosis not present

## 2024-03-26 DIAGNOSIS — S82001A Unspecified fracture of right patella, initial encounter for closed fracture: Secondary | ICD-10-CM | POA: Diagnosis not present

## 2024-03-26 DIAGNOSIS — Z7409 Other reduced mobility: Secondary | ICD-10-CM | POA: Diagnosis not present

## 2024-03-26 DIAGNOSIS — Z789 Other specified health status: Secondary | ICD-10-CM | POA: Diagnosis not present

## 2024-03-26 DIAGNOSIS — S82831A Other fracture of upper and lower end of right fibula, initial encounter for closed fracture: Secondary | ICD-10-CM | POA: Diagnosis not present

## 2024-03-26 DIAGNOSIS — S32031A Stable burst fracture of third lumbar vertebra, initial encounter for closed fracture: Secondary | ICD-10-CM | POA: Diagnosis not present

## 2024-03-26 DIAGNOSIS — S52502A Unspecified fracture of the lower end of left radius, initial encounter for closed fracture: Secondary | ICD-10-CM | POA: Diagnosis not present

## 2024-03-28 DIAGNOSIS — Z7409 Other reduced mobility: Secondary | ICD-10-CM | POA: Diagnosis not present

## 2024-03-28 DIAGNOSIS — Z789 Other specified health status: Secondary | ICD-10-CM | POA: Diagnosis not present

## 2024-04-03 DIAGNOSIS — Z7409 Other reduced mobility: Secondary | ICD-10-CM | POA: Diagnosis not present

## 2024-04-03 DIAGNOSIS — Z789 Other specified health status: Secondary | ICD-10-CM | POA: Diagnosis not present

## 2024-04-06 ENCOUNTER — Other Ambulatory Visit (HOSPITAL_COMMUNITY): Payer: Self-pay

## 2024-04-09 ENCOUNTER — Ambulatory Visit: Payer: Self-pay | Admitting: Cardiology

## 2024-04-30 ENCOUNTER — Other Ambulatory Visit: Payer: Self-pay | Admitting: Surgery

## 2024-04-30 DIAGNOSIS — I7121 Aneurysm of the ascending aorta, without rupture: Secondary | ICD-10-CM

## 2024-05-23 ENCOUNTER — Encounter: Payer: Self-pay | Admitting: Surgery

## 2024-05-31 ENCOUNTER — Other Ambulatory Visit

## 2024-06-05 ENCOUNTER — Ambulatory Visit
